# Patient Record
Sex: Female | Born: 1955 | Race: White | Hispanic: No | Marital: Married | Smoking: Never smoker
Health system: Southern US, Community
[De-identification: ages and names within clinical notes are randomized; demographics above are authoritative.]

## PROBLEM LIST (undated history)

## (undated) DIAGNOSIS — M8580 Other specified disorders of bone density and structure, unspecified site: Secondary | ICD-10-CM

## (undated) DIAGNOSIS — E079 Disorder of thyroid, unspecified: Secondary | ICD-10-CM

## (undated) HISTORY — PX: TUBAL LIGATION: SHX77

## (undated) HISTORY — PX: BREAST EXCISIONAL BIOPSY: SUR124

## (undated) HISTORY — PX: WRIST SURGERY: SHX841

## (undated) HISTORY — DX: Disorder of thyroid, unspecified: E07.9

## (undated) HISTORY — PX: BREAST BIOPSY: SHX20

---

## 1997-06-10 ENCOUNTER — Other Ambulatory Visit: Admission: RE | Admit: 1997-06-10 | Discharge: 1997-06-10 | Payer: Self-pay | Admitting: Obstetrics and Gynecology

## 1998-07-28 ENCOUNTER — Other Ambulatory Visit: Admission: RE | Admit: 1998-07-28 | Discharge: 1998-07-28 | Payer: Self-pay | Admitting: Obstetrics and Gynecology

## 1998-11-30 ENCOUNTER — Encounter: Admission: RE | Admit: 1998-11-30 | Discharge: 1998-11-30 | Payer: Self-pay | Admitting: Obstetrics and Gynecology

## 2000-05-02 ENCOUNTER — Other Ambulatory Visit: Admission: RE | Admit: 2000-05-02 | Discharge: 2000-05-02 | Payer: Self-pay | Admitting: Obstetrics and Gynecology

## 2001-05-07 ENCOUNTER — Other Ambulatory Visit: Admission: RE | Admit: 2001-05-07 | Discharge: 2001-05-07 | Payer: Self-pay | Admitting: Obstetrics and Gynecology

## 2001-05-12 ENCOUNTER — Encounter: Admission: RE | Admit: 2001-05-12 | Discharge: 2001-05-12 | Payer: Self-pay | Admitting: Obstetrics and Gynecology

## 2001-05-12 ENCOUNTER — Encounter: Payer: Self-pay | Admitting: Obstetrics and Gynecology

## 2002-05-20 ENCOUNTER — Encounter: Payer: Self-pay | Admitting: Obstetrics and Gynecology

## 2002-05-20 ENCOUNTER — Encounter: Admission: RE | Admit: 2002-05-20 | Discharge: 2002-05-20 | Payer: Self-pay | Admitting: Obstetrics and Gynecology

## 2003-06-22 ENCOUNTER — Encounter: Admission: RE | Admit: 2003-06-22 | Discharge: 2003-06-22 | Payer: Self-pay | Admitting: Obstetrics and Gynecology

## 2004-06-30 ENCOUNTER — Encounter: Admission: RE | Admit: 2004-06-30 | Discharge: 2004-06-30 | Payer: Self-pay | Admitting: Obstetrics and Gynecology

## 2005-08-23 ENCOUNTER — Encounter: Admission: RE | Admit: 2005-08-23 | Discharge: 2005-08-23 | Payer: Self-pay | Admitting: Obstetrics and Gynecology

## 2006-08-30 ENCOUNTER — Encounter: Admission: RE | Admit: 2006-08-30 | Discharge: 2006-08-30 | Payer: Self-pay | Admitting: Obstetrics and Gynecology

## 2007-05-08 ENCOUNTER — Ambulatory Visit (HOSPITAL_BASED_OUTPATIENT_CLINIC_OR_DEPARTMENT_OTHER): Admission: RE | Admit: 2007-05-08 | Discharge: 2007-05-08 | Payer: Self-pay | Admitting: Orthopedic Surgery

## 2007-09-16 ENCOUNTER — Encounter: Admission: RE | Admit: 2007-09-16 | Discharge: 2007-09-16 | Payer: Self-pay | Admitting: Obstetrics and Gynecology

## 2008-09-24 ENCOUNTER — Encounter: Admission: RE | Admit: 2008-09-24 | Discharge: 2008-09-24 | Payer: Self-pay | Admitting: Obstetrics and Gynecology

## 2008-10-14 ENCOUNTER — Ambulatory Visit (HOSPITAL_BASED_OUTPATIENT_CLINIC_OR_DEPARTMENT_OTHER): Admission: RE | Admit: 2008-10-14 | Discharge: 2008-10-14 | Payer: Self-pay | Admitting: Orthopedic Surgery

## 2009-11-21 ENCOUNTER — Encounter: Admission: RE | Admit: 2009-11-21 | Discharge: 2009-11-21 | Payer: Self-pay | Admitting: Obstetrics and Gynecology

## 2010-03-12 ENCOUNTER — Encounter: Payer: Self-pay | Admitting: Obstetrics and Gynecology

## 2010-07-04 NOTE — Op Note (Signed)
Margaret Gentry, Margaret Gentry                ACCOUNT NO.:  [id number]   MEDICAL RECORD NO.:  [id number]          PATIENT TYPE:  AMB   LOCATION:  DSC                          FACILITY:  [facility]   PHYSICIAN:  Cindee Salt, M.D.       DATE OF BIRTH:  Nov 19, 1955   DATE OF PROCEDURE:  05/08/2007  DATE OF DISCHARGE:                               OPERATIVE REPORT   PREOPERATIVE DIAGNOSIS:  Intra-articular comminuted fracture left distal  radius.   POSTOPERATIVE DIAGNOSIS:  Intra-articular comminuted fracture left  distal radius.   OPERATION:  Open reduction internal fixation left distal radius with  hand innovations, standard DVR of the left plate with interfragmentary  screws.   SURGEON:  Cindee Salt, M.D.   ASSISTANT:  Carolyne Fiscal R.N.   ANESTHESIA:  General.   ANESTHESIOLOGIST:  Maren Beach, M.D.   HISTORY:  The patient is a 55 year old female who suffered a fracture of  her left distal radius snow boarding approximately 5 days ago.  She is  admitted for open reduction internal fixation.  She is aware of risks  and complications; including infection, recurrence, injury to arteries,  nerves, tendons; complete relief of symptoms; dystrophy; nonunion; loss  of fixation; potential penetration of the articular surface.  In the  preoperative area the patient is seen, questions encouraged and  answered.   PROCEDURE:  Antibiotic given.  The patient was brought to the operating  room where a general anesthetic was carried out under Dr. Michaelle Copas  direction using LMA.  She was prepped using DuraPrep, supine position,  left arm free.  The limb was manipulated.  X-rays revealed reduction of  the fracture.  The elbow was also x-rayed.  No discrete fractures were  identified in both AP and lateral directions.  The limb was  exsanguinated with an Esmarch bandage.  Tourniquet placed high in the  arm, and was inflated to 250 mmHg.   A volar radial incision was made, carried to the radial side of the  styloid; and then back ulnarly, carried down through subcutaneous  tissue.  Bleeders were electrocauterized.  The dissection carried  through the flexor carpi radialis sheath.  The radial artery was  identified and protected.  Dissection carried down to the pronator  quadratus which was elevated off from the distal radius.  Fracture  fragments were immediately encountered.  A large ulnar spike of the  ulnar fragment was noted.  This was manipulated and reduced.  X-rays  confirmed reduction in the  AP and lateral direction.  The brachial  radialis did not form a significant deforming force; and as such, was  not removed.  The retractors were placed.   The fracture maintained in a reduced position.  It was decided to place  an interfragmentary screw using one of the hand innovations, fully  threaded, non locking screws.  This was drilled from the radial styloid  into the ulnar fragment.  This was first placed with a 22 mm screw which  x-rays revealed to be short.  This was replaced with a 26 mm screw which  stabilized the fracture fragment,  and held it in position.  A standard  sized DVR plate was then selected.  After drying both the narrow plate  and the standard plate, it was found that the standard plate fit, but  would be very close to both radial and ulnar side.  This was then  positioned.  The gliding screw hole was then drilled.  A 12 mm screw was  inserted.  X-rays confirmed positioning of the plate.  This was then  pinned with 45 K-wires.  The distal screws were then placed.  The  initial nonlocking screw on the ulnar side was placed after drilling and  measuring this was found to be a 22 mm screw, this was placed.  X-rays  confirmed that was not in the articular surface. and fully brought the  plate to the bone surface.  The remaining pegs were placed on the ulnar  side.  These measured between 18 and 24 mm.  The radial locking screws  were then placed.  X-rays confirmed that  none were within the joint.  The fracture was maintained in reduction with both normal tilt, normal  angulation, and normal length.  The remaining proximal locking screws  were then placed.  These all measured 12 mm.  X-rays confirmed  positioning of plate and screws.  The wound was irrigated.   The pronator quadratus was then repaired with figure-of-eight 4-0  Mersilene sutures.  The subcutaneous tissue closed with interrupted 4-0  Vicryl sutures as were the sutures for the pronator quadratus rather  than Mersilene.  The skin was closed with interrupted 4-0 Vicryl Rapide  sutures.  A sterile compressive dorsal palmar splint applied.  The  patient tolerated the procedure well and with deflation of the  tourniquet all fingers immediately pinked.  She was taken to the  recovery room for observation in satisfactory condition.  She will be  discharged home to return to the [facility] in 1 week on  Percocet.           ______________________________  Cindee Salt, M.D.     GK/MEDQ  D:  05/08/2007  T:  05/08/2007  Job:  [id number]

## 2010-07-04 NOTE — Op Note (Signed)
Margaret Gentry, Margaret Gentry                ACCOUNT NO.:  [id number]   MEDICAL RECORD NO.:  [id number]          PATIENT TYPE:  AMB   LOCATION:  DSC                          FACILITY:  [facility]   PHYSICIAN:  Cindee Salt, M.D.       DATE OF BIRTH:  [birth year]   DATE OF PROCEDURE:  10/14/2008  DATE OF DISCHARGE:                               OPERATIVE REPORT   PREOPERATIVE DIAGNOSIS:  Status post open reduction, internal fixation  left distal radius with Hand Innovation Plate.   POSTOPERATIVE DIAGNOSIS:  Status post open reduction, internal fixation  left distal radius with Hand Innovation Plate.   OPERATION:  Removal of plate and screws left distal radius.   SURGEON:  Dr. Merlyn Lot.   ASSISTANT:  __________   ANESTHESIA:  Axillary block with local infiltration.   ANESTHESIOLOGIST:  Burna Forts, M.D.   HISTORY:  The patient is a 55 year old female who suffered a distal  radius fracture and underwent open reduction, internal fixation.  She  has had some irritation from the plate and it has been elected to remove  this.  The postoperative course has been discussed along with the risks  and complications.  She is aware that there is no guarantee with  surgery, possibility of infection, recurrence of injury to arteries,  nerves, tendons, complete relief of symptoms, dystrophy.  In the  preoperative area, the patient is seen.  The extremity marked with both  patient and surgeon.  Antibiotic given.   PROCEDURE:  The patient was brought to the operating room where an  axillary block was carried out without difficulty.  She was prepped  using ChloraPrep, supine position, left arm free.  A 3-minute dry time  was allowed.  A timeout taken confirming the patient and procedure.  The  patient was then draped.  The limb exsanguinated with an Esmarch  bandage.  A tourniquet placed high and the arm was inflated 250 mmHg.  The old incision was used.  She had some feeling, this was locally  infiltrated  with quarter-percent Marcaine without epinephrine,  approximately 8 mL was used.  The dissection was carried down through  subcutaneous tissue.  The interval between the flexor carpi radialis  radial artery was opened.  This was followed down to the pronator  quadratus and FPL muscle attachment.  This was incised on its radial  border.  The plate was immediately identified.  The periosteum pronator  was elevated from the plate.  Significant bony growth was present about  the plate.  The proximal screws were removed without difficulty with the  [place], however, a screwdriver for the distal screws was  not available.  This was flashed, having been removed from the Hand  Innovation Set.  The distal screws were then removed without difficulty.  The plate was removed after removal of bony growth with curettes and  rongeur.  The plate was given to the patient along with the screws at  their request.  The area was debrided, irrigated.  The periosteum  pronator quadratus was then repaired with figure-of-eight 4-0 Vicryl  sutures,  the subcutaneous tissue with interrupted 4-0 Vicryl and the  skin with a subcuticular 4-0 Vicryl Rapide suture.  A sterile  compressive dressing, wrist splint was  applied.  Deflation of the tourniquet, all fingers immediately pinked.  She was taken to the recovery room for observation in satisfactory  condition.  She will be discharged home to return to the [facility]  [place] in 1 week on Vicodin.           ______________________________  Cindee Salt, M.D.     GK/MEDQ  D:  10/14/2008  T:  10/14/2008  Job:  [id number]   cc:   Malachi Pro. Ambrose Mantle, M.D.  Fax: [phone]

## 2010-10-27 ENCOUNTER — Other Ambulatory Visit: Payer: Self-pay | Admitting: Obstetrics and Gynecology

## 2010-10-27 DIAGNOSIS — Z1231 Encounter for screening mammogram for malignant neoplasm of breast: Secondary | ICD-10-CM

## 2010-11-24 ENCOUNTER — Ambulatory Visit
Admission: RE | Admit: 2010-11-24 | Discharge: 2010-11-24 | Disposition: A | Discharge status: Home / Self Care | Payer: 59 | Source: Ambulatory Visit | Attending: Obstetrics and Gynecology | Admitting: Obstetrics and Gynecology

## 2010-11-24 DIAGNOSIS — Z1231 Encounter for screening mammogram for malignant neoplasm of breast: Secondary | ICD-10-CM

## 2011-12-12 ENCOUNTER — Other Ambulatory Visit: Payer: Self-pay | Admitting: Obstetrics and Gynecology

## 2011-12-12 DIAGNOSIS — Z1231 Encounter for screening mammogram for malignant neoplasm of breast: Secondary | ICD-10-CM

## 2012-01-14 ENCOUNTER — Ambulatory Visit
Admission: RE | Admit: 2012-01-14 | Discharge: 2012-01-14 | Disposition: A | Discharge status: Home / Self Care | Payer: 59 | Source: Ambulatory Visit | Attending: Obstetrics and Gynecology | Admitting: Obstetrics and Gynecology

## 2012-01-14 DIAGNOSIS — Z1231 Encounter for screening mammogram for malignant neoplasm of breast: Secondary | ICD-10-CM

## 2012-09-26 ENCOUNTER — Other Ambulatory Visit: Payer: Self-pay | Admitting: Family Medicine

## 2012-09-26 DIAGNOSIS — R109 Unspecified abdominal pain: Secondary | ICD-10-CM

## 2012-10-03 ENCOUNTER — Other Ambulatory Visit: Payer: Self-pay | Admitting: Family Medicine

## 2012-10-03 ENCOUNTER — Ambulatory Visit
Admission: RE | Admit: 2012-10-03 | Discharge: 2012-10-03 | Disposition: A | Discharge status: Home / Self Care | Payer: 59 | Source: Ambulatory Visit | Attending: Family Medicine | Admitting: Family Medicine

## 2012-10-03 DIAGNOSIS — R109 Unspecified abdominal pain: Secondary | ICD-10-CM

## 2012-10-03 MED ORDER — IOHEXOL 300 MG/ML  SOLN
100.0000 mL | Freq: Once | INTRAMUSCULAR | Status: AC | PRN
  Administered 2012-10-03: 100 mL via INTRAVENOUS

## 2012-12-18 ENCOUNTER — Other Ambulatory Visit: Payer: Self-pay

## 2012-12-18 DIAGNOSIS — Z1231 Encounter for screening mammogram for malignant neoplasm of breast: Secondary | ICD-10-CM

## 2013-01-19 ENCOUNTER — Encounter: Payer: Self-pay | Admitting: Podiatry

## 2013-01-19 ENCOUNTER — Ambulatory Visit (INDEPENDENT_AMBULATORY_CARE_PROVIDER_SITE_OTHER): Payer: 59 | Admitting: Podiatry

## 2013-01-19 VITALS — BP 122/70 | HR 61 | Resp 18

## 2013-01-19 DIAGNOSIS — L259 Unspecified contact dermatitis, unspecified cause: Secondary | ICD-10-CM

## 2013-01-19 NOTE — Progress Notes (Signed)
   Subjective:    Patient ID: Margaret Gentry, female    DOB: [birth year], 57 y.o.   MRN: [id number]  HPI I have some fungus on my toenail and has been going on for about 4 months now and week to the doctor about 2 weeks ago and was put on an antibotic and has been using a topical    Review of Systems  Constitutional: Negative.   HENT: Negative.   Eyes: Negative.   Respiratory: Negative.   Cardiovascular: Negative.   Gastrointestinal: Negative.   Endocrine: Negative.   Genitourinary: Negative.   Musculoskeletal: Negative.   Skin: Negative.   Allergic/Immunologic: Negative.   Neurological: Negative.   Hematological: Negative.   Psychiatric/Behavioral: Negative.        Objective:   Physical Exam  Subjective: 57 year old white female orientated x3.  Vascular: The DP and PT pulses are two over four bilaterally  Neurological: Sensation intact bilaterally  Dermatological: The distal left hallux has some scaling skin and the distal nail plate is detaching from the nail bed. No active drainage noted.       Assessment & Plan:   Assessment: Evidence of old resolved infection, with healing skin on the distal left hallux.  Plan: I debrided the distal left hallux and saw no evidence of skin lesions, or drainage or dystrophic nail changes. Patient advised to apply Vaseline or topical antibiotic to the distal toe on the left hallux daily, cover with a Band-Aid until healed. Reappoint at patient's request.

## 2013-01-19 NOTE — Patient Instructions (Signed)
Apply Vaseline to the end of the left big toe and cover with Band-Aid daily until healed.

## 2013-01-23 ENCOUNTER — Ambulatory Visit
Admission: RE | Admit: 2013-01-23 | Discharge: 2013-01-23 | Disposition: A | Discharge status: Home / Self Care | Payer: 59 | Source: Ambulatory Visit

## 2013-01-23 DIAGNOSIS — Z1231 Encounter for screening mammogram for malignant neoplasm of breast: Secondary | ICD-10-CM

## 2014-01-08 ENCOUNTER — Other Ambulatory Visit: Payer: Self-pay

## 2014-01-08 DIAGNOSIS — Z1231 Encounter for screening mammogram for malignant neoplasm of breast: Secondary | ICD-10-CM

## 2014-01-29 ENCOUNTER — Ambulatory Visit
Admission: RE | Admit: 2014-01-29 | Discharge: 2014-01-29 | Disposition: A | Discharge status: Home / Self Care | Payer: 59 | Source: Ambulatory Visit

## 2014-01-29 ENCOUNTER — Encounter (INDEPENDENT_AMBULATORY_CARE_PROVIDER_SITE_OTHER): Payer: Self-pay

## 2014-01-29 DIAGNOSIS — Z1231 Encounter for screening mammogram for malignant neoplasm of breast: Secondary | ICD-10-CM

## 2014-10-13 ENCOUNTER — Other Ambulatory Visit: Payer: Self-pay

## 2014-10-13 DIAGNOSIS — Z1231 Encounter for screening mammogram for malignant neoplasm of breast: Secondary | ICD-10-CM

## 2014-11-05 ENCOUNTER — Ambulatory Visit
Admission: RE | Admit: 2014-11-05 | Discharge: 2014-11-05 | Disposition: A | Discharge status: Home / Self Care | Payer: 59 | Source: Ambulatory Visit

## 2014-11-05 DIAGNOSIS — Z1231 Encounter for screening mammogram for malignant neoplasm of breast: Secondary | ICD-10-CM

## 2015-08-07 ENCOUNTER — Emergency Department (HOSPITAL_COMMUNITY): Payer: BLUE CROSS/BLUE SHIELD

## 2015-08-07 ENCOUNTER — Emergency Department (HOSPITAL_COMMUNITY): Payer: BLUE CROSS/BLUE SHIELD | Admitting: Anesthesiology

## 2015-08-07 ENCOUNTER — Encounter (HOSPITAL_COMMUNITY): Payer: Self-pay | Admitting: Emergency Medicine

## 2015-08-07 ENCOUNTER — Observation Stay (HOSPITAL_COMMUNITY)
Admission: EM | Admit: 2015-08-07 | Discharge: 2015-08-08 | Disposition: A | Discharge status: Home / Self Care | Payer: BLUE CROSS/BLUE SHIELD | Attending: Orthopedic Surgery | Admitting: Orthopedic Surgery

## 2015-08-07 ENCOUNTER — Encounter (HOSPITAL_COMMUNITY)
Admission: EM | Disposition: A | Discharge status: Home / Self Care | Payer: Self-pay | Source: Home / Self Care | Attending: Emergency Medicine

## 2015-08-07 DIAGNOSIS — Y9301 Activity, walking, marching and hiking: Secondary | ICD-10-CM | POA: Insufficient documentation

## 2015-08-07 DIAGNOSIS — S52571A Other intraarticular fracture of lower end of right radius, initial encounter for closed fracture: Secondary | ICD-10-CM | POA: Diagnosis not present

## 2015-08-07 DIAGNOSIS — G5601 Carpal tunnel syndrome, right upper limb: Secondary | ICD-10-CM | POA: Diagnosis not present

## 2015-08-07 DIAGNOSIS — Q7190 Unspecified reduction defect of unspecified upper limb: Secondary | ICD-10-CM

## 2015-08-07 DIAGNOSIS — S5291XA Unspecified fracture of right forearm, initial encounter for closed fracture: Secondary | ICD-10-CM

## 2015-08-07 DIAGNOSIS — S6411XA Injury of median nerve at wrist and hand level of right arm, initial encounter: Secondary | ICD-10-CM | POA: Diagnosis not present

## 2015-08-07 DIAGNOSIS — S52611A Displaced fracture of right ulna styloid process, initial encounter for closed fracture: Secondary | ICD-10-CM | POA: Insufficient documentation

## 2015-08-07 DIAGNOSIS — Z792 Long term (current) use of antibiotics: Secondary | ICD-10-CM | POA: Diagnosis not present

## 2015-08-07 DIAGNOSIS — W010XXA Fall on same level from slipping, tripping and stumbling without subsequent striking against object, initial encounter: Secondary | ICD-10-CM | POA: Diagnosis not present

## 2015-08-07 DIAGNOSIS — Z79899 Other long term (current) drug therapy: Secondary | ICD-10-CM | POA: Diagnosis not present

## 2015-08-07 DIAGNOSIS — Y92009 Unspecified place in unspecified non-institutional (private) residence as the place of occurrence of the external cause: Secondary | ICD-10-CM | POA: Diagnosis not present

## 2015-08-07 DIAGNOSIS — W19XXXA Unspecified fall, initial encounter: Secondary | ICD-10-CM

## 2015-08-07 DIAGNOSIS — S52509A Unspecified fracture of the lower end of unspecified radius, initial encounter for closed fracture: Secondary | ICD-10-CM | POA: Diagnosis present

## 2015-08-07 DIAGNOSIS — E039 Hypothyroidism, unspecified: Secondary | ICD-10-CM | POA: Insufficient documentation

## 2015-08-07 DIAGNOSIS — S6990XA Unspecified injury of unspecified wrist, hand and finger(s), initial encounter: Secondary | ICD-10-CM | POA: Diagnosis present

## 2015-08-07 HISTORY — PX: OPEN REDUCTION INTERNAL FIXATION (ORIF) DISTAL RADIAL FRACTURE: SHX5989

## 2015-08-07 SURGERY — OPEN REDUCTION INTERNAL FIXATION (ORIF) DISTAL RADIUS FRACTURE
Anesthesia: General | Laterality: Right

## 2015-08-07 MED ORDER — LACTATED RINGERS IV SOLN
INTRAVENOUS | Status: DC | PRN
  Administered 2015-08-07 (×3): via INTRAVENOUS

## 2015-08-07 MED ORDER — BUPIVACAINE-EPINEPHRINE (PF) 0.5% -1:200000 IJ SOLN
INTRAMUSCULAR | Status: DC | PRN
  Administered 2015-08-07: 25 mL via PERINEURAL

## 2015-08-07 MED ORDER — LEVOTHYROXINE SODIUM 75 MCG PO TABS
75.0000 ug | ORAL_TABLET | Freq: Every day | ORAL | Status: DC
  Administered 2015-08-08: 75 ug via ORAL
  Filled 2015-08-07: qty 1

## 2015-08-07 MED ORDER — MORPHINE SULFATE (PF) 2 MG/ML IV SOLN: 1.0000 mg | INTRAVENOUS | Status: DC | PRN

## 2015-08-07 MED ORDER — ETOMIDATE 2 MG/ML IV SOLN
INTRAVENOUS | Status: AC | PRN
  Administered 2015-08-07: 10 mg via INTRAVENOUS

## 2015-08-07 MED ORDER — LACTATED RINGERS IV SOLN: INTRAVENOUS | Status: DC | PRN

## 2015-08-07 MED ORDER — ONDANSETRON HCL 4 MG/2ML IJ SOLN
INTRAMUSCULAR | Status: AC
Start: 2015-08-07 — End: 2015-08-07
  Filled 2015-08-07: qty 2

## 2015-08-07 MED ORDER — SUCCINYLCHOLINE CHLORIDE 200 MG/10ML IV SOSY
PREFILLED_SYRINGE | INTRAVENOUS | Status: AC
  Filled 2015-08-07: qty 10

## 2015-08-07 MED ORDER — CEFAZOLIN SODIUM 1 G IJ SOLR
INTRAMUSCULAR | Status: DC | PRN
  Administered 2015-08-07: 2 g via INTRAMUSCULAR

## 2015-08-07 MED ORDER — ONDANSETRON HCL 4 MG/2ML IJ SOLN
INTRAMUSCULAR | Status: DC | PRN
  Administered 2015-08-07: 4 mg via INTRAVENOUS

## 2015-08-07 MED ORDER — EPHEDRINE SULFATE 50 MG/ML IJ SOLN
INTRAMUSCULAR | Status: DC | PRN
  Administered 2015-08-07: 15 mg via INTRAVENOUS
  Administered 2015-08-07 (×2): 10 mg via INTRAVENOUS
  Administered 2015-08-07: 15 mg via INTRAVENOUS

## 2015-08-07 MED ORDER — FAMOTIDINE 20 MG PO TABS: 20.0000 mg | ORAL_TABLET | Freq: Two times a day (BID) | ORAL | Status: DC | PRN

## 2015-08-07 MED ORDER — LIDOCAINE HCL (PF) 1 % IJ SOLN
INTRAMUSCULAR | Status: AC
  Administered 2015-08-07: 10:00:00
  Filled 2015-08-07: qty 5

## 2015-08-07 MED ORDER — METHOCARBAMOL 1000 MG/10ML IJ SOLN
500.0000 mg | Freq: Four times a day (QID) | INTRAVENOUS | Status: DC | PRN
  Filled 2015-08-07: qty 5

## 2015-08-07 MED ORDER — KETOROLAC TROMETHAMINE 30 MG/ML IJ SOLN
30.0000 mg | Freq: Once | INTRAMUSCULAR | Status: AC
  Administered 2015-08-07: 30 mg via INTRAMUSCULAR
  Filled 2015-08-07: qty 1

## 2015-08-07 MED ORDER — ONDANSETRON HCL 4 MG/2ML IJ SOLN
INTRAMUSCULAR | Status: AC
  Filled 2015-08-07: qty 2

## 2015-08-07 MED ORDER — CEFAZOLIN SODIUM 1-5 GM-% IV SOLN
1.0000 g | Freq: Three times a day (TID) | INTRAVENOUS | Status: DC
  Administered 2015-08-07 – 2015-08-08 (×2): 1 g via INTRAVENOUS
  Filled 2015-08-07 (×5): qty 50

## 2015-08-07 MED ORDER — MIDAZOLAM HCL 2 MG/2ML IJ SOLN
INTRAMUSCULAR | Status: DC | PRN
  Administered 2015-08-07: 2 mg via INTRAVENOUS

## 2015-08-07 MED ORDER — ONDANSETRON HCL 4 MG/2ML IJ SOLN
4.0000 mg | Freq: Once | INTRAMUSCULAR | Status: AC
  Administered 2015-08-07: 4 mg via INTRAVENOUS

## 2015-08-07 MED ORDER — DEXAMETHASONE SODIUM PHOSPHATE 10 MG/ML IJ SOLN
INTRAMUSCULAR | Status: AC
  Filled 2015-08-07: qty 1

## 2015-08-07 MED ORDER — PROPOFOL 10 MG/ML IV BOLUS
INTRAVENOUS | Status: AC
  Filled 2015-08-07: qty 20

## 2015-08-07 MED ORDER — FENTANYL CITRATE (PF) 250 MCG/5ML IJ SOLN
INTRAMUSCULAR | Status: AC
  Filled 2015-08-07: qty 5

## 2015-08-07 MED ORDER — CEFAZOLIN SODIUM 1-5 GM-% IV SOLN
INTRAVENOUS | Status: AC
  Filled 2015-08-07: qty 50

## 2015-08-07 MED ORDER — HYDROMORPHONE HCL 1 MG/ML IJ SOLN
1.0000 mg | Freq: Once | INTRAMUSCULAR | Status: AC
  Administered 2015-08-07: 1 mg via INTRAMUSCULAR
  Filled 2015-08-07: qty 1

## 2015-08-07 MED ORDER — ETOMIDATE 2 MG/ML IV SOLN
10.0000 mg | Freq: Once | INTRAVENOUS | Status: DC
  Filled 2015-08-07: qty 10

## 2015-08-07 MED ORDER — FENTANYL CITRATE (PF) 250 MCG/5ML IJ SOLN
INTRAMUSCULAR | Status: DC | PRN
  Administered 2015-08-07 (×2): 50 ug via INTRAVENOUS

## 2015-08-07 MED ORDER — EPHEDRINE 5 MG/ML INJ
INTRAVENOUS | Status: AC
  Filled 2015-08-07: qty 10

## 2015-08-07 MED ORDER — DEXAMETHASONE SODIUM PHOSPHATE 10 MG/ML IJ SOLN
INTRAMUSCULAR | Status: DC | PRN
  Administered 2015-08-07: 10 mg via INTRAVENOUS

## 2015-08-07 MED ORDER — MEPERIDINE HCL 25 MG/ML IJ SOLN: 6.2500 mg | INTRAMUSCULAR | Status: DC | PRN

## 2015-08-07 MED ORDER — DOCUSATE SODIUM 100 MG PO CAPS
100.0000 mg | ORAL_CAPSULE | Freq: Two times a day (BID) | ORAL | Status: DC
  Administered 2015-08-07 – 2015-08-08 (×2): 100 mg via ORAL
  Filled 2015-08-07 (×2): qty 1

## 2015-08-07 MED ORDER — VITAMIN C 500 MG PO TABS
1000.0000 mg | ORAL_TABLET | Freq: Every day | ORAL | Status: DC
  Administered 2015-08-07 – 2015-08-08 (×2): 1000 mg via ORAL
  Filled 2015-08-07 (×2): qty 2

## 2015-08-07 MED ORDER — SCOPOLAMINE 1 MG/3DAYS TD PT72
MEDICATED_PATCH | TRANSDERMAL | Status: DC | PRN
  Administered 2015-08-07: 1 via TRANSDERMAL

## 2015-08-07 MED ORDER — ONDANSETRON HCL 4 MG/2ML IJ SOLN
4.0000 mg | Freq: Four times a day (QID) | INTRAMUSCULAR | Status: DC | PRN
Start: 2015-08-07 — End: 2015-08-08

## 2015-08-07 MED ORDER — ONDANSETRON HCL 4 MG PO TABS: 4.0000 mg | ORAL_TABLET | Freq: Four times a day (QID) | ORAL | Status: DC | PRN

## 2015-08-07 MED ORDER — LACTATED RINGERS IV SOLN
INTRAVENOUS | Status: DC
  Administered 2015-08-07: 17:00:00 via INTRAVENOUS

## 2015-08-07 MED ORDER — ALPRAZOLAM 0.5 MG PO TABS: 0.5000 mg | ORAL_TABLET | Freq: Four times a day (QID) | ORAL | Status: DC | PRN

## 2015-08-07 MED ORDER — OXYCODONE HCL 5 MG PO TABS: 5.0000 mg | ORAL_TABLET | Freq: Once | ORAL | Status: DC | PRN

## 2015-08-07 MED ORDER — OXYCODONE HCL 5 MG PO TABS
5.0000 mg | ORAL_TABLET | ORAL | Status: DC | PRN
  Administered 2015-08-08: 10 mg via ORAL
  Administered 2015-08-08: 5 mg via ORAL
  Filled 2015-08-07 (×2): qty 1
  Filled 2015-08-07: qty 2

## 2015-08-07 MED ORDER — PROPOFOL 10 MG/ML IV BOLUS
INTRAVENOUS | Status: DC | PRN
  Administered 2015-08-07: 200 mg via INTRAVENOUS

## 2015-08-07 MED ORDER — LIDOCAINE 2% (20 MG/ML) 5 ML SYRINGE
INTRAMUSCULAR | Status: AC
Start: 2015-08-07 — End: 2015-08-07
  Filled 2015-08-07: qty 5

## 2015-08-07 MED ORDER — PROMETHAZINE HCL 25 MG RE SUPP: 12.5000 mg | Freq: Four times a day (QID) | RECTAL | Status: DC | PRN

## 2015-08-07 MED ORDER — HYDROMORPHONE HCL 1 MG/ML IJ SOLN
INTRAMUSCULAR | Status: AC
  Administered 2015-08-07: 1 mg
  Filled 2015-08-07: qty 1

## 2015-08-07 MED ORDER — MIDAZOLAM HCL 2 MG/2ML IJ SOLN
INTRAMUSCULAR | Status: AC
  Filled 2015-08-07: qty 2

## 2015-08-07 MED ORDER — HYDROMORPHONE HCL 1 MG/ML IJ SOLN: 0.2500 mg | INTRAMUSCULAR | Status: DC | PRN

## 2015-08-07 MED ORDER — 0.9 % SODIUM CHLORIDE (POUR BTL) OPTIME
TOPICAL | Status: DC | PRN
  Administered 2015-08-07: 1000 mL

## 2015-08-07 MED ORDER — CEFAZOLIN SODIUM 1-5 GM-% IV SOLN
1.0000 g | INTRAVENOUS | Status: AC
  Administered 2015-08-07: 1 g via INTRAVENOUS

## 2015-08-07 MED ORDER — OXYCODONE HCL 5 MG/5ML PO SOLN: 5.0000 mg | Freq: Once | ORAL | Status: DC | PRN

## 2015-08-07 MED ORDER — METHOCARBAMOL 500 MG PO TABS
500.0000 mg | ORAL_TABLET | Freq: Four times a day (QID) | ORAL | Status: DC | PRN
  Administered 2015-08-08: 500 mg via ORAL
  Filled 2015-08-07: qty 1

## 2015-08-07 SURGICAL SUPPLY — 63 items
BANDAGE ACE 3X5.8 VEL STRL LF (GAUZE/BANDAGES/DRESSINGS) ×3 IMPLANT
BANDAGE ACE 4X5 VEL STRL LF (GAUZE/BANDAGES/DRESSINGS) ×3 IMPLANT
BANDAGE ELASTIC 3 VELCRO ST LF (GAUZE/BANDAGES/DRESSINGS) ×3 IMPLANT
BANDAGE ELASTIC 4 VELCRO ST LF (GAUZE/BANDAGES/DRESSINGS) ×3 IMPLANT
BIT DRILL 2.2 SS TIBIAL (BIT) ×3 IMPLANT
BLADE SURG ROTATE 9660 (MISCELLANEOUS) IMPLANT
BNDG ESMARK 4X9 LF (GAUZE/BANDAGES/DRESSINGS) IMPLANT
BNDG GAUZE ELAST 4 BULKY (GAUZE/BANDAGES/DRESSINGS) ×9 IMPLANT
CORDS BIPOLAR (ELECTRODE) ×3 IMPLANT
COVER SURGICAL LIGHT HANDLE (MISCELLANEOUS) ×3 IMPLANT
CUFF TOURNIQUET SINGLE 18IN (TOURNIQUET CUFF) ×3 IMPLANT
DECANTER SPIKE VIAL GLASS SM (MISCELLANEOUS) IMPLANT
DRAIN TLS ROUND 10FR (DRAIN) IMPLANT
DRAPE OEC MINIVIEW 54X84 (DRAPES) ×3 IMPLANT
DRAPE U-SHAPE 47X51 STRL (DRAPES) ×3 IMPLANT
DRSG ADAPTIC 3X8 NADH LF (GAUZE/BANDAGES/DRESSINGS) ×3 IMPLANT
GAUZE SPONGE 4X4 12PLY STRL (GAUZE/BANDAGES/DRESSINGS) ×6 IMPLANT
GAUZE XEROFORM 1X8 LF (GAUZE/BANDAGES/DRESSINGS) ×3 IMPLANT
GAUZE XEROFORM 5X9 LF (GAUZE/BANDAGES/DRESSINGS) ×3 IMPLANT
GLOVE BIOGEL PI IND STRL 7.5 (GLOVE) ×2 IMPLANT
GLOVE BIOGEL PI INDICATOR 7.5 (GLOVE) ×4
GLOVE SS BIOGEL STRL SZ 8 (GLOVE) ×1 IMPLANT
GLOVE SUPERSENSE BIOGEL SZ 8 (GLOVE) ×2
GOWN STRL REUS W/ TWL LRG LVL3 (GOWN DISPOSABLE) ×1 IMPLANT
GOWN STRL REUS W/ TWL XL LVL3 (GOWN DISPOSABLE) ×1 IMPLANT
GOWN STRL REUS W/TWL LRG LVL3 (GOWN DISPOSABLE) ×2
GOWN STRL REUS W/TWL XL LVL3 (GOWN DISPOSABLE) ×3
KIT BASIN OR (CUSTOM PROCEDURE TRAY) ×3 IMPLANT
KIT ROOM TURNOVER OR (KITS) ×3 IMPLANT
LOOP VESSEL MAXI BLUE (MISCELLANEOUS) IMPLANT
MANIFOLD NEPTUNE II (INSTRUMENTS) ×3 IMPLANT
NEEDLE 22X1 1/2 (OR ONLY) (NEEDLE) IMPLANT
NS IRRIG 1000ML POUR BTL (IV SOLUTION) ×3 IMPLANT
PACK ORTHO EXTREMITY (CUSTOM PROCEDURE TRAY) ×3 IMPLANT
PAD ARMBOARD 7.5X6 YLW CONV (MISCELLANEOUS) ×6 IMPLANT
PAD CAST 4YDX4 CTTN HI CHSV (CAST SUPPLIES) ×1 IMPLANT
PADDING CAST ABS 4INX4YD NS (CAST SUPPLIES) ×4
PADDING CAST ABS COTTON 4X4 ST (CAST SUPPLIES) ×2 IMPLANT
PADDING CAST COTTON 4X4 STRL (CAST SUPPLIES) ×2
PEG LOCKING SMOOTH 2.2X16 (Screw) ×3 IMPLANT
PEG LOCKING SMOOTH 2.2X18 (Peg) ×9 IMPLANT
PEG LOCKING SMOOTH 2.2X20 (Screw) ×9 IMPLANT
PILLOW ARM CARTER PEDIATRIC (MISCELLANEOUS) ×3 IMPLANT
PLATE DVR CROSSLOCK STD RT (Plate) ×3 IMPLANT
PUTTY DBM STAGRAFT PLUS 2CC (Putty) ×3 IMPLANT
SCREW LOCK 12X2.7X 3 LD (Screw) ×1 IMPLANT
SCREW LOCK 14X2.7X 3 LD TPR (Screw) ×1 IMPLANT
SCREW LOCKING 2.7X12MM (Screw) ×3 IMPLANT
SCREW LOCKING 2.7X13MM (Screw) ×3 IMPLANT
SCREW LOCKING 2.7X14 (Screw) ×3 IMPLANT
SPONGE LAP 4X18 X RAY DECT (DISPOSABLE) IMPLANT
SUT MNCRL AB 4-0 PS2 18 (SUTURE) ×9 IMPLANT
SUT PROLENE 3 0 PS 2 (SUTURE) ×3 IMPLANT
SUT VIC AB 3-0 FS2 27 (SUTURE) ×3 IMPLANT
SYR CONTROL 10ML LL (SYRINGE) IMPLANT
SYSTEM CHEST DRAIN TLS 7FR (DRAIN) ×3 IMPLANT
TOWEL OR 17X24 6PK STRL BLUE (TOWEL DISPOSABLE) ×3 IMPLANT
TOWEL OR 17X26 10 PK STRL BLUE (TOWEL DISPOSABLE) ×3 IMPLANT
TUBE CONNECTING 12'X1/4 (SUCTIONS) ×1
TUBE CONNECTING 12X1/4 (SUCTIONS) ×2 IMPLANT
TUBE EVACUATION TLS (MISCELLANEOUS) ×3 IMPLANT
UNDERPAD 30X30 INCONTINENT (UNDERPADS AND DIAPERS) ×3 IMPLANT
WATER STERILE IRR 1000ML POUR (IV SOLUTION) ×3 IMPLANT

## 2015-08-07 NOTE — Sedation Documentation (Signed)
Pt placed on 5 lead, 2L [place], BP cuff and pulse ox for post conscious sedation. 1L normal saline started via IV to left AC. Ambu bag at bedside.

## 2015-08-07 NOTE — ED Notes (Signed)
Pt taken to OR per Dr. Amanda PeaGramig

## 2015-08-07 NOTE — Progress Notes (Signed)
Orthopedic Tech Progress Note Patient Details:  Margaret Gentry 01/14/1956 [id number]  Ortho Devices Type of Ortho Device: Ace wrap, Sugartong splint Ortho Device/Splint Interventions: Application   Margaret Gentry 08/07/2015, 10:58 AM

## 2015-08-07 NOTE — ED Notes (Signed)
Pt here after fall today with right wrist pain; obvious deformity noted; pt is severe pain

## 2015-08-07 NOTE — Transfer of Care (Signed)
Immediate Anesthesia Transfer of Care Note  Patient: Shon HaleMary Beth B Jutras  Procedure(s) Performed: Procedure(s): OPEN REDUCTION INTERNAL FIXATION (ORIF) DISTAL RADIAL FRACTURE (Right)  Patient Location: PACU  Anesthesia Type:General and Regional  Level of Consciousness: awake  Airway & Oxygen Therapy: Patient Spontanous Breathing and Patient connected to nasal cannula oxygen  Post-op Assessment: Report given to RN and Post -op Vital signs reviewed and stable  Post vital signs: Reviewed and stable  Last Vitals:  Filed Vitals:   08/07/15 1200 08/07/15 1245  BP: 133/78 114/75  Pulse: 57 61  Resp: 18 11    Last Pain:  Filed Vitals:   08/07/15 1253  PainSc: 10-Worst pain ever         Complications: No apparent anesthesia complications

## 2015-08-07 NOTE — ED Provider Notes (Signed)
CSN: [id number]     Arrival date & time 08/07/15  1004 History   First MD Initiated Contact with Patient 08/07/15 1008     Chief Complaint  Patient presents with  . Wrist Injury     (Consider location/radiation/quality/duration/timing/severity/associated sxs/prior Treatment) HPI Patient presents after mechanical fall. Patient recalls the entirety of the fall. Just prior to ED arrival she was walking, slipped, fell onto her right side and right wrist. Since that time there has been pain about an obvious deformity of the right wrist. Patient states that she is generally well denies substantial medical problems. She does have a history of orthopedic surgery in the contralateral wrist, but no recent issues. Since onset pain is worse with motion, is sharp, severe, throughout the hand with mild numbness throughout the hand as well.  Past Medical History  Diagnosis Date  . Thyroid disease    Past Surgical History  Procedure Laterality Date  . Wrist surgery     History reviewed. No pertinent family history. Social History  Substance Use Topics  . Smoking status: Never Smoker   . Smokeless tobacco: Never Used  . Alcohol Use: No   OB History    No data available     Review of Systems  Constitutional: Negative for fever.  HENT: Negative.   Gastrointestinal: Negative for vomiting.  Musculoskeletal:       Negative aside from HPI  Skin:       Negative aside from HPI  Allergic/Immunologic: Negative for immunocompromised state.  Neurological: Positive for numbness. Negative for weakness.      Allergies  Review of patient's allergies indicates no known allergies.  Home Medications   Prior to Admission medications   Medication Sig Start Date End Date Taking? Authorizing Provider  cephALEXin (KEFLEX) 500 MG capsule  01/02/13   Historical Provider, MD  Estradiol-Norethindrone Acet 0.5-0.1 MG per tablet  12/27/12   Historical Provider, MD  Karlene EinsteinLINZESS 145 MCG CAPS capsule  01/01/13    Historical Provider, MD  SYNTHROID 50 MCG tablet  12/27/12   Historical Provider, MD   There were no vitals taken for this visit. Physical Exam  Constitutional: She is oriented to person, place, and time. She appears well-developed and well-nourished. No distress.  HENT:  Head: Normocephalic and atraumatic.  Eyes: Conjunctivae and EOM are normal.  Cardiovascular: Normal rate, regular rhythm and intact distal pulses.   Pulmonary/Chest: Effort normal. No stridor.  Abdominal: She exhibits no distension.  Musculoskeletal: She exhibits no edema.       Right elbow: Normal.      Right wrist: She exhibits deformity.       Arms: Neurological: She is alert and oriented to person, place, and time. No cranial nerve deficit.  She can feel the hand in all three major nerve distribution areas, though she does have diffuse numbness.  Skin: Skin is warm and dry.  Psychiatric: She has a normal mood and affect.  Nursing note and vitals reviewed.   ED Course  .Sedation Date/Time: 08/07/2015 1:07 PM Performed by: Gerhard MunchLOCKWOOD, Manali Mcelmurry Authorized by: Gerhard MunchLOCKWOOD, Andalyn Heckstall  Consent:    Consent obtained:  Written   Consent given by:  Patient   Risks discussed:  Dysrhythmia, inadequate sedation, nausea, vomiting, respiratory compromise necessitating ventilatory assistance and intubation and prolonged sedation necessitating reversal Indications:    Sedation purpose:  Fracture reduction   Procedure necessitating sedation performed by:  Physician performing sedation   Intended level of sedation:  Moderate (conscious sedation) Pre-sedation assessment:  Time since last food or drink:  5   NPO status caution: urgency dictates proceeding with non-ideal NPO status     ASA classification: class 1 - normal, healthy patient     Neck mobility: normal     Mouth opening:  3 or more finger widths   Thyromental distance:  4 finger widths   Mallampati score:  I - soft palate, uvula, fauces, pillars visible   Pre-sedation  assessments completed and reviewed comment:  Done   Pre-sedation assessment completed:  08/07/2015 11:00 AM Immediate pre-procedure details:    Reassessment: Patient reassessed immediately prior to procedure     Reviewed: vital signs     Verified: bag valve mask available, emergency equipment available, intubation equipment available, IV patency confirmed, oxygen available and suction available   Procedure details (see MAR for exact dosages):    Sedation start time:  08/07/2015 11:45 AM   Preoxygenation:  Nasal cannula   Sedation:  Etomidate   Analgesia:  Hydromorphone   Intra-procedure monitoring:  Blood pressure monitoring, cardiac monitor, continuous capnometry, continuous pulse oximetry, frequent LOC assessments and frequent vital sign checks   Intra-procedure events: none     Sedation end time:  08/07/2015 12:15 PM   Total sedation time (minutes):  30 Post-procedure details:    Post-sedation assessment completed:  08/07/2015 12:45 PM   Attendance: Constant attendance by certified staff until patient recovered     Recovery: Patient returned to pre-procedure baseline     Post-sedation assessments completed and reviewed: airway patency, cardiovascular function, hydration status, mental status and nausea/vomiting     Patient is stable for discharge or admission: Yes     Patient tolerance:  Tolerated well, no immediate complications Comments:     Some nausea w vomiting, otherwise uncomplicated Reduction of fracture Date/Time: 08/07/2015 12:00 PM Performed by: Gerhard Munch Authorized by: Gerhard Munch Consent: Verbal consent obtained. Written consent obtained. Risks and benefits: risks, benefits and alternatives were discussed Consent given by: patient Patient understanding: patient states understanding of the procedure being performed Patient consent: the patient's understanding of the procedure matches consent given Procedure consent: procedure consent matches procedure  scheduled Relevant documents: relevant documents present and verified Test results: test results available and properly labeled Site marked: the operative site was marked Imaging studies: imaging studies available Required items: required blood products, implants, devices, and special equipment available Patient identity confirmed: verbally with patient Time out: Immediately prior to procedure a "time out" was called to verify the correct patient, procedure, equipment, support staff and site/side marked as required. Preparation: Patient was prepped and draped in the usual sterile fashion. Local anesthesia used: yes Anesthesia: hematoma block Local anesthetic: lidocaine 1% without epinephrine Anesthetic total: 4 ml Patient sedated: yes (see other note) Sedation type: moderate (conscious) sedation Sedatives: etomidate Analgesia: hydromorphone Vitals: Vital signs were monitored during sedation. Patient tolerance: Patient tolerated the procedure well with no immediate complications Comments: On arrival the patient had hematoma block applied, intramuscular narcotics applied. Subsequent she was placed in finger traps, with weight for traction. However, this did not achieve appropriate reduction. Subsequently, the patient had conscious sedation, with substantial reduction of the fracture.    (including critical care time)  Imaging Review Dg Wrist 2 Views Right  08/07/2015  CLINICAL DATA:  Obvious deformity to the right wrist. Pt fell backwards today at home and tried to catch herself with her right hand. Most of her pain is on the lateral distal portion of the right wrist. EXAM: RIGHT WRIST -  2 VIEW COMPARISON:  None. FINDINGS: There is a comminuted displaced fracture of the distal radial metaphysis with fractures extending to the distal radial articular surface. The comminuted fracture components have displaced posteriorly with the carpus posterior displacement is at least 12 mm. Fracture  fragments have also displaced proximally by at least 1 cm. There are no other fractures. IMPRESSION: Comminuted and significantly displaced fracture of the distal right radial metaphysis and epiphysis as detailed above. Electronically Signed   By: Amie Portland M.D.   On: 08/07/2015 10:42   I have personally reviewed and evaluated these images and lab results as part of my medical decision-making.  Immediately after the patient's arrival to the emergency department I assessed her given her extreme pain. Performed a hematoma block with 1% lidocaine after obtaining verbal consent. Patient received intermuscular Dilaudid, and had substantial decrease in her pain level. Patient was then placed in fingertrap for reduction of her distal radius fracture after a reviewed the portable wrist x-ray.  Patient tolerated initiation of these fingertrap well.   Update: Patient tolerated reduction after conscious sedation with etomidate well but had persistent numbness throughout her hand. After discussion with evaluation by our hand surgeon colleague, patient was taken to the operating room for carpal tunnel decompression.  MDM   Final diagnoses:  Fall  Distal radius fracture Numbness  Patient presents after mechanical fall, has obvious deformity and substantial pain on arrival. Patient had conscious sedation with reduction of distal radius comminuted fracture. However, the patient had persistent numbness, and with some concern for carpal tunnel compression of median nerve patient went to the operating room with our hand surgery team for additional treatment.  Gerhard Munch, MD 08/07/15 1315

## 2015-08-07 NOTE — Op Note (Signed)
See dictation#[id number] SP ORIF right radius and CTR with fascial release Faisal Stradling MD

## 2015-08-07 NOTE — Progress Notes (Signed)
Pt did well with clear liquid dinner and snack this evening. No nausea reported. Pt also requested scopolamine patch to under L ear be removed. States she does not have any dizziness, nausea, etc. Pt states ok w/ advancing diet for morning meal.

## 2015-08-07 NOTE — H&P (Signed)
Margaret Gentry is an 60 y.o. female.   Chief Complaint: Right radius fracture displaced with acute carpal tunnel syndrome and median nerve contusion HPI: Patient presents for evaluation. Emergency room staff has seen her and performed an initial reduction. Prior to the reduction she had significant numbness in her hand and afterwards the numbness worsened. She had a hematoma block. She's had a prior left wrist fracture. This is the right wrist in question. She had an injury today as described in the notes.  She denies neck back chest or abdominal pain.  I've spoken with the ER physician and patient. I was called after the initial in the our evaluation and reduction. I've did not have the opportunity to evaluate her prior to her reduction and splinting  In my opinion she has some degree of an obvious median nerve contusion but I do feel that she has elements of acute carpal tunnel syndrome at play.  Past Medical History  Diagnosis Date  . Thyroid disease     Past Surgical History  Procedure Laterality Date  . Wrist surgery      History reviewed. No pertinent family history. Social History:  reports that she has never smoked. She has never used smokeless tobacco. She reports that she does not drink alcohol or use illicit drugs.  Allergies: No Known Allergies   (Not in a hospital admission)  No results found for this or any previous visit (from the past 48 hour(s)). Dg Wrist 2 Views Right  08/07/2015  CLINICAL DATA:  Post reduction of right wrist fracture. EXAM: RIGHT WRIST - 2 VIEW COMPARISON:  08/07/2015 FINDINGS: There has been interval reduction of previously demonstrated severely comminuted intra-articular distal radial fracture. There is residual mild dorsal angulation of the distal fracture fragment at the main fracture line. There is mild residual impaction. Due to severe comminution there is a butterfly fragment which is still displaced dorsally. There is an associated soft  tissue swelling. Casting material overlies the wrist. IMPRESSION: Status post reduction of severely comminuted intra-articular distal radial fracture with residual mild dorsal angulation of the main fracture fragments, and dorsal displacement of a butterfly fragment. Mild residual impaction. Electronically Signed   By: Ted Mcalpineobrinka  Dimitrova M.D.   On: 08/07/2015 12:37   Dg Wrist 2 Views Right  08/07/2015  CLINICAL DATA:  Obvious deformity to the right wrist. Pt fell backwards today at home and tried to catch herself with her right hand. Most of her pain is on the lateral distal portion of the right wrist. EXAM: RIGHT WRIST - 2 VIEW COMPARISON:  None. FINDINGS: There is a comminuted displaced fracture of the distal radial metaphysis with fractures extending to the distal radial articular surface. The comminuted fracture components have displaced posteriorly with the carpus posterior displacement is at least 12 mm. Fracture fragments have also displaced proximally by at least 1 cm. There are no other fractures. IMPRESSION: Comminuted and significantly displaced fracture of the distal right radial metaphysis and epiphysis as detailed above. Electronically Signed   By: Amie Portlandavid  Ormond M.D.   On: 08/07/2015 10:42    Review of Systems  HENT: Negative.   Eyes: Negative.   Respiratory: Negative.   Cardiovascular: Negative.   Gastrointestinal: Negative.   Genitourinary: Negative.   Skin: Negative.   Neurological: Negative.   Endo/Heme/Allergies: Negative.     Blood pressure 114/75, pulse 61, resp. rate 11, SpO2 99 %. Physical Exam pleasant female. The right wrist is wrapped. She is tender and cannot move the  fingers. The patient states she has significant numbness in the median nerve distribution. There is no evidence of bleeding. There is no evidence of refill loss. Her graft she is pain with passive extension of the fingers and has acute carpal tunnel symptomatology. Lower extremity examination is benign.  Left upper extremity has IV access and has a prior ORIF performed outside of my office by Dr. Merlyn Lot The patient is alert and oriented in no acute distress. The patient complains of pain in the affected upper extremity.  The patient is noted to have a normal HEENT exam. Lung fields show equal chest expansion and no shortness of breath. Abdomen exam is nontender without distention. Lower extremity examination does not show any fracture dislocation or blood clot symptoms. Pelvis is stable and the neck and back are stable and nontender.  Assessment/Plan #1 acute right distal radius fracture displaced #2 acute carpal tunnel syndrome #3 median nerve contusion right wrist region  I discussed with patient I feel she has a significantly displaced fracture that is improved after the ER reduction. I was called after the ER reduction to assess. My assessment reveals continued displacement as well as ailments of a median nerve which is likely contused and also under compression given the severity of the initial displacement.  I discussed with the patient that it would be my recommendation to consider ORIF and carpal tunnel release as soon as possible due to the median nerve deficit. I discussed with her that she likely has elements of both the contusion and compression. While we will eliminate the compression the contusion may still cause median nerve symptoms which will have to be observed for recovery  The patient prefers to proceed as soon as possible.  We are planning surgery for your upper extremity. The risk and benefits of surgery to include risk of bleeding, infection, anesthesia,  damage to normal structures and failure of the surgery to accomplish its intended goals of relieving symptoms and restoring function have been discussed in detail. With this in mind we plan to proceed. I have specifically discussed with the patient the pre-and postoperative regime and the dos and don'ts and risk and  benefits in great detail. Risk and benefits of surgery also include risk of dystrophy(CRPS), chronic nerve pain, failure of the healing process to go onto completion and other inherent risks of surgery The relavent the pathophysiology of the disease/injury process, as well as the alternatives for treatment and postoperative course of action has been discussed in great detail with the patient who desires to proceed.  We will do everything in our power to help you (the patient) restore function to the upper extremity. It is a pleasure to see this patient today.   Karen Chafe, MD 08/07/2015, 1:08 PM

## 2015-08-07 NOTE — Anesthesia Preprocedure Evaluation (Addendum)
Anesthesia Evaluation  Patient identified by MRN, date of birth, ID band Patient awake    Reviewed: Allergy & Precautions, NPO status , Patient's Chart, lab work & pertinent test results  Airway Mallampati: I  TM Distance: >3 FB Neck ROM: Full    Dental  (+) Teeth Intact, Dental Advisory Given   Pulmonary    breath sounds clear to auscultation       Cardiovascular  Rhythm:Regular Rate:Normal     Neuro/Psych    GI/Hepatic   Endo/Other  Hypothyroidism   Renal/GU      Musculoskeletal   Abdominal   Peds  Hematology   Anesthesia Other Findings   Reproductive/Obstetrics                             Anesthesia Physical Anesthesia Plan  ASA: II and emergent  Anesthesia Plan: General   Post-op Pain Management: GA combined w/ Regional for post-op pain   Induction: Intravenous  Airway Management Planned:   Additional Equipment:   Intra-op Plan:   Post-operative Plan: Extubation in OR  Informed Consent: I have reviewed the patients History and Physical, chart, labs and discussed the procedure including the risks, benefits and alternatives for the proposed anesthesia with the patient or authorized representative who has indicated his/her understanding and acceptance.   Dental advisory given  Plan Discussed with: CRNA, Surgeon and Anesthesiologist  Anesthesia Plan Comments:         Anesthesia Quick Evaluation

## 2015-08-07 NOTE — Anesthesia Procedure Notes (Addendum)
Anesthesia Regional Block:  Infraclavicular brachial plexus block  Pre-Anesthetic Checklist: ,, timeout performed, Correct Patient, Correct Site, Correct Laterality, Correct Procedure, Correct Position, site marked, Risks and benefits discussed,  Surgical consent,  Pre-op evaluation,  At surgeon's request and post-op pain management  Laterality: Right and Upper  Prep: chloraprep       Needles:  Injection technique: Single-shot  Needle Type: Echogenic Stimulator Needle     Needle Length: 5cm 5 cm Needle Gauge: 21 and 21 G    Additional Needles:  Procedures: ultrasound guided (picture in chart) Infraclavicular brachial plexus block Narrative:  Start time: 08/07/2015 1:29 PM End time: 08/07/2015 1:34 PM Injection made incrementally with aspirations every 5 mL.  Performed by: Personally  Anesthesiologist: CREWS, DAVID   Procedure Name: Intubation Date/Time: 08/07/2015 1:52 PM Performed by: Alanda AmassFRIEDMAN, Nasire Reali A Pre-anesthesia Checklist: Patient identified, Emergency Drugs available, Suction available, Patient being monitored and Timeout performed Patient Re-evaluated:Patient Re-evaluated prior to inductionOxygen Delivery Method: Circle System Utilized and Circle system utilized Preoxygenation: Pre-oxygenation with 100% oxygen Intubation Type: IV induction, Rapid sequence and Cricoid Pressure applied Ventilation: Mask ventilation without difficulty Laryngoscope Size: Mac and 3 Grade View: Grade I Tube type: Oral Tube size: 7.5 mm Number of attempts: 1 Airway Equipment and Method: Stylet Placement Confirmation: ETT inserted through vocal cords under direct vision,  positive ETCO2 and breath sounds checked- equal and bilateral Secured at: 21 cm Tube secured with: Tape Dental Injury: Teeth and Oropharynx as per pre-operative assessment       Right Infraclavicular block image

## 2015-08-07 NOTE — Anesthesia Postprocedure Evaluation (Signed)
Anesthesia Post Note  Patient: Margaret Gentry  Procedure(s) Performed: Procedure(s) (LRB): OPEN REDUCTION INTERNAL FIXATION (ORIF) DISTAL RADIAL FRACTURE (Right)  Patient location during evaluation: PACU Anesthesia Type: General Level of consciousness: awake and alert Pain management: pain level controlled Vital Signs Assessment: post-procedure vital signs reviewed and stable Respiratory status: spontaneous breathing, nonlabored ventilation, respiratory function stable and patient connected to nasal cannula oxygen Cardiovascular status: blood pressure returned to baseline and stable Postop Assessment: no signs of nausea or vomiting Anesthetic complications: no    Last Vitals:  Filed Vitals:   08/07/15 1618 08/07/15 1623  BP: 122/76   Pulse: 73 67  Temp:  36.5 C  Resp: 13 15    Last Pain:  Filed Vitals:   08/07/15 1623  PainSc: 10-Worst pain ever                 Melvine Julin A

## 2015-08-08 ENCOUNTER — Encounter (HOSPITAL_COMMUNITY): Payer: Self-pay | Admitting: Orthopedic Surgery

## 2015-08-08 MED ORDER — OXYCODONE HCL 5 MG PO TABS: 5.0000 mg | ORAL_TABLET | ORAL | Status: DC | PRN

## 2015-08-08 MED ORDER — METHOCARBAMOL 500 MG PO TABS: 500.0000 mg | ORAL_TABLET | Freq: Four times a day (QID) | ORAL | Status: DC | PRN

## 2015-08-08 NOTE — Op Note (Signed)
Margaret Gentry, Margaret Gentry                ACCOUNT NO.:  [id number]  MEDICAL RECORD NO.:  [id number]  LOCATION:  5N19C                        FACILITY:  [facility]  PHYSICIAN:  Margaret Ano. Keaun Schnabel, M.D.DATE OF BIRTH:  06/17/1955  DATE OF PROCEDURE: DATE OF DISCHARGE:                              OPERATIVE REPORT   PREOPERATIVE DIAGNOSES:  Acute displaced distal radius fracture with impending compartment syndrome and acute carpal tunnel symptomatology with associated median nerve injury/contusion presumed.  POSTOPERATIVE DIAGNOSES:  Acute displaced distal radius fracture with impending compartment syndrome and acute carpal tunnel symptomatology with associated median nerve injury/contusion presumed.  PROCEDURE: 1. Open reduction and internal fixation of right radius fracture with     Biomet Cross lock plate and 2 mL of stay graft (allograft bone     graft secondary to metaphyseal defect). 2. AP lateral and oblique x-rays performed, examined and interpreted     by myself, right wrist and forearm. 3. Closed treatment of ulnar styloid fracture. 4. Open carpal tunnel release. 5. Fasciotomy, superficial and deep volar compartments of the forearm.  SURGEON:  Margaret Ano. Margaret Gentry, M.D.  ASSISTANT:  None.  COMPLICATIONS:  None.  ANESTHESIA:  General preoperative block.  TOURNIQUET TIME:  Less than an hour.  DRAINS:  One #7 TLS.  TOURNIQUET TIME:  Less than an hour.  INDICATIONS FOR THE PROCEDURE:  This patient is a 60 year old female, who presented to the emergency room.  She saw emergency room staff, complained of the known distal radius fracture as well as numbness and tingling.  The ER staff sedated her, performed a preliminary reduction and then called myself.  After calling me, I came to the patient's bedside promptly as the chart reflects and examined the patient.  She was in a severe amount of pain.  She could not move her fingers well, had numbness and tingling in the median nerve  distribution.  The ER physician states that he performed a hematoma block with sedation, but did not inject medicine around the carpal canal.  The patient notes that ________ but worsening numbness has developed after the reduction. Given this information, I told the patient that I would highly recommend prompt fixation and release of her compartments and carpal tunnel in an effort to try and give the nerves optimal environment to live throughout and survive.  With this information in mind, the patient consented to surgery.  OPERATIVE PROCEDURE:  The patient was seen by myself and Anesthesia, taken to the operative suite, underwent smooth induction of general anesthetic.  Preoperatively, she was given a block by Dr. Sheldon Silvan. Once in the operative theater, I performed a very careful Hibiclens pre- scrub followed by 10 minutes surgical Betadine scrub, followed by sterile draping.  I performed scrub by myself in an effort to not increase her comminution.  Following this, we performed very careful and cautious time-out.  Preoperative antibiotics were given in the form of 2 g of Ancef and the operation commenced with elevation of the tourniquet. Volar radial incision was made.  Dissection was carried down. Superficial and deep volar forearm compartments were released with scissor tip by myself without difficulty.  I was able to release the compartments nicely.  All muscles were viable.  Once this was complete, the FCR was retracted as was the carpal canal contents.  The pronator was incised and I set about reducing her fracture.  She had a large amount of metaphyseal comminution, 2 mL of stay graft was placed for purposes of filling the metaphyseal void.  I then reduced the fracture, held fixation and applied a DVR standard cross lock plate.  Adequate radial height, inclination, and volar tilt were obtained and plate had good sound fixation.  I performed AP, lateral, and oblique x-rays  which revealed the proper geometry and good plate fixation with proper screw length.  All parameters looked well.  Following this, I performed a live fluoroscopy.  Ulnar styloid fracture was noted that the TFC was stable as well as the distal radioulnar joint.  The scaphoid and lunate looked stable.  I was pleased with this. At this time, I irrigated copiously, closed the pronator and turned attention toward the carpal canal.  1 inch incision was made in the region of the carpal canal.  The palmar fascia was released.  Following release of the palmar fascia, I then very carefully released the transverse carpal ligament.  Fat pad egressed nicely distally.  I confirmed distal release, followed by distal and proximal dissection and direct release of the proximal leaflet.  This was done under direct vision with iatrogenic nerve injury.  There was some blood in the canal, which was evacuated and there were no complicating features.  Following this, we irrigated, deflated the tourniquet, placed a TLS drain in the main wound and closed this with Prolene of the 3-0 and 4-0 variety.  She tolerated this well.  Compartments were all soft.  Refill was excellent.  Carpal canal was closed as well.  I made sure hemostasis was excellent as possible and also placed a well- padded comfortable volar splint with a dorsal slab.  The patient tolerated this well.  There were no complicating features.  Following this, we discussed the operation with her family after she was transported to the recovery room.  We will plan for IV antibiotics, general postop pain management, and Carter arm pillow to elevate her maximum.  They understand the median nerve is in the optimal environment for healing.  They also understand that it may take quite a bit of time for the patient to realize meaningful nerve recovery.  I have discussed these issues with her at length and the findings.  At this juncture, she has good  stable sound fixation.  The nerve was in optimal environment.  I feel the nerve has had a contusive injury, which was observed intraoperatively by notable contusive region about the nerve and also feel the nerve had compression phenomenon.  Hopefully, the patient will recover swiftly.  She understands this as does her husband.  Pleasure to see her today and participate in her care.  Look forward to participate in her postop recovery.     Margaret AnoWilliam M. Margaret PeaGramig, M.D.     [facility] LLCWMG/MEDQ  D:  08/07/2015  T:  08/07/2015  Job:  [id number]

## 2015-08-08 NOTE — Discharge Instructions (Signed)
Keep bandage clean and dry.  Call for any problems.  No smoking.  Criteria for driving a car: you should be off your pain medicine for 7-8 hours, able to drive one handed(confident), thinking clearly and feeling able in your judgement to drive. Continue elevation as it will decrease swelling.  If instructed by MD move your fingers within the confines of the bandage/splint.  Use ice if instructed by your MD. Call immediately for any sudden loss of feeling in your hand/arm or change in functional abilities of the extremity. We recommend that you to take vitamin C 1000 mg a day to promote healing. We also recommend that if you require  pain medicine that you take a stool softener to prevent constipation as most pain medicines will have constipation side effects. We recommend either Peri-Colace or Senokot and recommend that you also consider adding MiraLAX as well to prevent the constipation affects from pain medicine if you are required to use them. These medicines are over the counter and may be purchased at a local pharmacy. A cup of yogurt and a probiotic can also be helpful during the recovery process as the medicines can disrupt your intestinal environment. In addition, please take vitamin B 6 200 mg daily.

## 2015-08-08 NOTE — Evaluation (Signed)
Occupational Therapy Evaluation Patient Details Name: Margaret Gentry MRN: [id number] DOB: 1955-04-19 Today's Date: 08/08/2015    History of Present Illness Open reduction and internal fixation of right radius fracture , R carpal tunnel release   Clinical Impression   Pt doing well. Pt requires set up - sup/min A with ADLs and pt is Independent with mobility. Pt's R UE immobilized and NWB through hand/wrist. Pt allowed ROM of R fingers, elbow and shoulder as tolerated. Pt educated on positioning of R UE in elevation for edema control and sling wear, dressing techniques. All education completed and no further Acute OT indicated at this time. Progress rehab of R wrist as ordered per MD when appropriate    Follow Up Recommendations  No OT follow up (progress rehab of R wrist as ordered per MD when appropriate)    Equipment Recommendations  None recommended by OT    Recommendations for Other Services       Precautions / Restrictions Precautions Precautions: Other (comment) (R wrist) Precaution Comments: NWB, R Wrist immobilized Required Braces or Orthoses: Sling;Other Brace/Splint Other Brace/Splint: soft cast/splint R wrist/forearm Restrictions Weight Bearing Restrictions: Yes RUE Weight Bearing: Non weight bearing Other Position/Activity Restrictions: ROM allowed in  elbow and shoulder, fingers. Keep R UE elevated when in bed or seated. Sling on when moving around      Mobility Bed Mobility Overal bed mobility: Independent                Transfers Overall transfer level: Independent                    Balance Overall balance assessment: No apparent balance deficits (not formally assessed)                                          ADL Overall ADL's : Needs assistance/impaired     Grooming: Wash/dry hands;Wash/dry face;Applying deodorant;Oral care;Set up;Supervision/safety   Upper Body Bathing: Set up;Supervision/ safety   Lower  Body Bathing: Supervison/ safety;Set up   Upper Body Dressing : Supervision/safety;Set up   Lower Body Dressing: Minimal assistance   Toilet Transfer: Independent   Toileting- Clothing Manipulation and Hygiene: Supervision/safety   Tub/ Shower Transfer: Independent   Functional mobility during ADLs: Independent       Vision  reading glasses, no change from baseline              Pertinent Vitals/Pain Pain Assessment: 0-10 Pain Score: 2  Pain Descriptors / Indicators: Aching;Sore Pain Intervention(s): Monitored during session;Repositioned     Hand Dominance Right   Extremity/Trunk Assessment Upper Extremity Assessment Upper Extremity Assessment: RUE deficits/detail RUE: Unable to fully assess due to immobilization       Cervical / Trunk Assessment Cervical / Trunk Assessment: Normal   Communication Communication Communication: No difficulties   Cognition Arousal/Alertness: Awake/alert Behavior During Therapy: WFL for tasks assessed/performed Overall Cognitive Status: Within Functional Limits for tasks assessed                     General Comments   Pt very pleasant and cooperative, husband very supportive    Exercises   Other Exercises Other Exercises: ROM finger, elbow and shoulder pt and spouse educated on sling wear/postioning and elevation of R UE for edema control         Home Living Family/patient expects to be discharged  to:: Private residence Living Arrangements: Spouse/significant other Available Help at Discharge: Family Type of Home: House Home Access: Stairs to enter Secretary/administratorntrance Stairs-Number of Steps: 2   Home Layout: One level     Bathroom Shower/Tub: Producer, television/film/videoWalk-in shower   Bathroom Toilet: Standard     Home Equipment: None          Prior Functioning/Environment Level of Independence: Independent             OT Diagnosis: Acute pain   OT Problem List: Impaired UE functional use;Pain;Decreased range of motion   OT  Treatment/Interventions:      OT Goals(Current goals can be found in the care plan section) Acute Rehab OT Goals Patient Stated Goal: go home today OT Goal Formulation: With patient/family  OT Frequency:     Barriers to D/C:  none                        End of Session Equipment Utilized During Treatment: Other (comment) (sling) Nurse Communication: Mobility status  Activity Tolerance: Patient tolerated treatment well Patient left: in bed;with call bell/phone within reach;with family/visitor present   Time: [id number] OT Time Calculation (min): 25 min Charges:  OT General Charges $OT Visit: 1 Procedure OT Evaluation $OT Eval Moderate Complexity: 1 Procedure OT Treatments $Therapeutic Exercise: 8-22 mins G-Codes:    Galen ManilaSpencer, Charon Akamine Jeanette 08/08/2015, 9:48 AM

## 2015-08-08 NOTE — Discharge Summary (Signed)
Physician Discharge Summary  Patient ID: Margaret Gentry MRN: [id number] DOB/AGE: [birth year] 60 y.o.  Admit date: 09-02-15 Discharge date:  08/08/2015  Admission Diagnoses: right wrist fracture with acute carpal tunnel syndrome Past Medical History  Diagnosis Date  . Thyroid disease     Discharge Diagnoses:  Active Problems:   Distal radius fracture   Surgeries: Procedure(s): OPEN REDUCTION INTERNAL FIXATION (ORIF) DISTAL RADIAL FRACTURE on 2015-09-02    Consultants:  none  Discharged Condition: Improved  Hospital Course: [name] is an 60 y.o. female who was admitted 09-02-2015 with a chief complaint of Chief Complaint  Patient presents with  . Wrist Injury  , and found to have a diagnosis of right wrist fracture with acute carpal tunnel syndrome.  They were brought to the operating room on 09-02-15 and underwent Procedure(s): OPEN REDUCTION INTERNAL FIXATION (ORIF) DISTAL RADIAL FRACTURE.    They were given perioperative antibiotics: Anti-infectives    Start     Dose/Rate Route Frequency Ordered Stop   09/02/15 2200  ceFAZolin (ANCEF) IVPB 1 g/50 mL premix     1 g 100 mL/hr over 30 Minutes Intravenous Every 8 hours Sep 02, 2015 1646     2015-09-02 1600  ceFAZolin (ANCEF) IVPB 1 g/50 mL premix     1 g 100 mL/hr over 30 Minutes Intravenous NOW Sep 02, 2015 1553 2015/09/02 1627   Sep 02, 2015 1556  ceFAZolin (ANCEF) 1-5 GM-% IVPB    Comments:  Citty, Teresa   : cabinet override      09/02/15 1556 08/08/15 0359    .  They were given sequential compression devices, early ambulation.  Recent vital signs: Patient Vitals for the past 24 hrs:  BP Temp Temp src Pulse Resp SpO2 Height Weight  08/08/15 0335 (!) 96/52 mmHg 98.6 F (37 C) Oral 66 15 96 % - -  02-Sep-2015 1943 (!) 98/46 mmHg - - 71 15 98 % - -  2015-09-02 1814 (!) 104/56 mmHg 97.9 F (36.6 C) Oral 67 15 100 % - -  09/02/15 1701 121/75 mmHg 97.9 F (36.6 C) Oral 80 16 100 %  (1.575 m) 72.576 kg (160 lb)   2015-09-02 1623 - 97.7 F (36.5 C) - 67 15 100 % - -  09-02-15 1618 122/76 mmHg - - 73 13 100 % - -  02-Sep-2015 1603 126/73 mmHg - - 81 18 100 % - -  09/02/2015 1600 - - - 85 14 100 % - -  September 02, 2015 1548 120/71 mmHg - - 80 15 98 % - -  September 02, 2015 1533 109/78 mmHg 97.6 F (36.4 C) - 94 18 96 % - -  Sep 02, 2015 1245 114/75 mmHg - - 61 11 99 % - -  2015-09-02 1200 133/78 mmHg - - (!) 57 18 100 % - -  2015/09/02 1155 134/76 mmHg - - 62 18 100 % - -  September 02, 2015 1150 - - - (!) 59 18 100 % - -  September 02, 2015 1145 148/78 mmHg - - - - - - -  Sep 02, 2015 1130 135/79 mmHg - - (!) 59 - 100 % - -  02-Sep-2015 1115 127/83 mmHg - - (!) 57 - 100 % - -  .  Recent laboratory studies: Dg Wrist 2 Views Right  September 02, 2015  CLINICAL DATA:  Post reduction of right wrist fracture. EXAM: RIGHT WRIST - 2 VIEW COMPARISON:  09-02-15 FINDINGS: There has been interval reduction of previously demonstrated severely comminuted intra-articular distal radial fracture. There is residual mild dorsal angulation of the  distal fracture fragment at the main fracture line. There is mild residual impaction. Due to severe comminution there is a butterfly fragment which is still displaced dorsally. There is an associated soft tissue swelling. Casting material overlies the wrist. IMPRESSION: Status post reduction of severely comminuted intra-articular distal radial fracture with residual mild dorsal angulation of the main fracture fragments, and dorsal displacement of a butterfly fragment. Mild residual impaction. Electronically Signed   By: Ted Mcalpineobrinka  Dimitrova M.D.   On: 08/07/2015 12:37   Dg Wrist 2 Views Right  08/07/2015  CLINICAL DATA:  Obvious deformity to the right wrist. Pt fell backwards today at home and tried to catch herself with her right hand. Most of her pain is on the lateral distal portion of the right wrist. EXAM: RIGHT WRIST - 2 VIEW COMPARISON:  None. FINDINGS: There is a comminuted displaced fracture of the distal radial metaphysis with  fractures extending to the distal radial articular surface. The comminuted fracture components have displaced posteriorly with the carpus posterior displacement is at least 12 mm. Fracture fragments have also displaced proximally by at least 1 cm. There are no other fractures. IMPRESSION: Comminuted and significantly displaced fracture of the distal right radial metaphysis and epiphysis as detailed above. Electronically Signed   By: Amie Portlandavid  Ormond M.D.   On: 08/07/2015 10:42    Discharge Medications:     Medication List    TAKE these medications        cephALEXin 500 MG capsule  Commonly known as:  KEFLEX     Estradiol-Norethindrone Acet 0.5-0.1 MG tablet     LINZESS 145 MCG Caps capsule  Generic drug:  linaclotide     methocarbamol 500 MG tablet  Commonly known as:  ROBAXIN  Take 1 tablet (500 mg total) by mouth every 6 (six) hours as needed for muscle spasms.     multivitamin with minerals tablet  Take 1 tablet by mouth daily.     oxyCODONE 5 MG immediate release tablet  Commonly known as:  Oxy IR/ROXICODONE  Take 1-2 tablets (5-10 mg total) by mouth every 3 (three) hours as needed for moderate pain.     levothyroxine 75 MCG tablet  Commonly known as:  SYNTHROID, LEVOTHROID  Take 75 mcg by mouth daily before breakfast.     SYNTHROID 50 MCG tablet  Generic drug:  levothyroxine        Diagnostic Studies: Dg Wrist 2 Views Right  08/07/2015  CLINICAL DATA:  Post reduction of right wrist fracture. EXAM: RIGHT WRIST - 2 VIEW COMPARISON:  08/07/2015 FINDINGS: There has been interval reduction of previously demonstrated severely comminuted intra-articular distal radial fracture. There is residual mild dorsal angulation of the distal fracture fragment at the main fracture line. There is mild residual impaction. Due to severe comminution there is a butterfly fragment which is still displaced dorsally. There is an associated soft tissue swelling. Casting material overlies the wrist.  IMPRESSION: Status post reduction of severely comminuted intra-articular distal radial fracture with residual mild dorsal angulation of the main fracture fragments, and dorsal displacement of a butterfly fragment. Mild residual impaction. Electronically Signed   By: Ted Mcalpineobrinka  Dimitrova M.D.   On: 08/07/2015 12:37   Dg Wrist 2 Views Right  08/07/2015  CLINICAL DATA:  Obvious deformity to the right wrist. Pt fell backwards today at home and tried to catch herself with her right hand. Most of her pain is on the lateral distal portion of the right wrist. EXAM: RIGHT WRIST - 2 VIEW  COMPARISON:  None. FINDINGS: There is a comminuted displaced fracture of the distal radial metaphysis with fractures extending to the distal radial articular surface. The comminuted fracture components have displaced posteriorly with the carpus posterior displacement is at least 12 mm. Fracture fragments have also displaced proximally by at least 1 cm. There are no other fractures. IMPRESSION: Comminuted and significantly displaced fracture of the distal right radial metaphysis and epiphysis as detailed above. Electronically Signed   By: Amie Portland M.D.   On: 08/07/2015 10:42    They benefited maximally from their hospital stay and there were no complications.     Disposition: Final discharge disposition not confirmed     Discharge Instructions    Call MD / Call 911    Complete by:  As directed   If you experience chest pain or shortness of breath, CALL 911 and be transported to the hospital emergency room.  If you develope a fever above 101 F, pus (white drainage) or increased drainage or redness at the wound, or calf pain, call your surgeon's office.     Constipation Prevention    Complete by:  As directed   Drink plenty of fluids.  Prune juice may be helpful.  You may use a stool softener, such as Colace (over the counter) 100 mg twice a day.  Use MiraLax (over the counter) for constipation as needed.     Diet - low  sodium heart healthy    Complete by:  As directed      Discharge instructions    Complete by:  As directed   Keep bandage clean and dry.  Call for any problems.  No smoking.  Criteria for driving a car: you should be off your pain medicine for 7-8 hours, able to drive one handed(confident), thinking clearly and feeling able in your judgement to drive. Continue elevation as it will decrease swelling.  If instructed by MD move your fingers within the confines of the bandage/splint.  Use ice if instructed by your MD. Call immediately for any sudden loss of feeling in your hand/arm or change in functional abilities of the extremity.We recommend that you to take vitamin C 1000 mg a day to promote healing. We also recommend that if you require  pain medicine that you take a stool softener to prevent constipation as most pain medicines will have constipation side effects. We recommend either Peri-Colace or Senokot and recommend that you also consider adding MiraLAX as well to prevent the constipation affects from pain medicine if you are required to use them. These medicines are over the counter and may be purchased at a local pharmacy. A cup of yogurt and a probiotic can also be helpful during the recovery process as the medicines can disrupt your intestinal environment. He is taking vitamin B 6 200 mg daily as well.     Increase activity slowly as tolerated    Complete by:  As directed           Follow-up Information    Follow up with Karen Chafe, MD In 2 weeks.   Specialty:  Orthopedic Surgery   Why:  For wound re-check, For suture removal   Contact information:   [address] [place] [address] [phone]        Signed: Sheran Lawless 08/08/2015, 8:29 AM

## 2015-08-08 NOTE — Progress Notes (Signed)
Reviewed discharge instructions/medications with patient and patient's husband.  Answered all their questions.

## 2015-08-21 ENCOUNTER — Encounter (HOSPITAL_BASED_OUTPATIENT_CLINIC_OR_DEPARTMENT_OTHER): Payer: Self-pay

## 2015-08-21 ENCOUNTER — Emergency Department (HOSPITAL_BASED_OUTPATIENT_CLINIC_OR_DEPARTMENT_OTHER)
Admission: EM | Admit: 2015-08-21 | Discharge: 2015-08-21 | Disposition: A | Discharge status: Home / Self Care | Payer: BLUE CROSS/BLUE SHIELD | Attending: Emergency Medicine | Admitting: Emergency Medicine

## 2015-08-21 DIAGNOSIS — T7840XA Allergy, unspecified, initial encounter: Secondary | ICD-10-CM | POA: Insufficient documentation

## 2015-08-21 DIAGNOSIS — L509 Urticaria, unspecified: Secondary | ICD-10-CM | POA: Diagnosis present

## 2015-08-21 DIAGNOSIS — Z79899 Other long term (current) drug therapy: Secondary | ICD-10-CM | POA: Diagnosis not present

## 2015-08-21 MED ORDER — FAMOTIDINE IN NACL 20-0.9 MG/50ML-% IV SOLN
20.0000 mg | Freq: Once | INTRAVENOUS | Status: AC
  Administered 2015-08-21: 20 mg via INTRAVENOUS
  Filled 2015-08-21: qty 50

## 2015-08-21 MED ORDER — HYDROXYZINE HCL 25 MG PO TABS: 25.0000 mg | ORAL_TABLET | Freq: Four times a day (QID) | ORAL | Status: DC | PRN

## 2015-08-21 MED ORDER — METHYLPREDNISOLONE SODIUM SUCC 125 MG IJ SOLR
125.0000 mg | Freq: Once | INTRAMUSCULAR | Status: AC
  Administered 2015-08-21: 125 mg via INTRAVENOUS
  Filled 2015-08-21: qty 2

## 2015-08-21 MED ORDER — DIPHENHYDRAMINE HCL 50 MG/ML IJ SOLN
25.0000 mg | Freq: Once | INTRAMUSCULAR | Status: AC
  Administered 2015-08-21: 25 mg via INTRAVENOUS
  Filled 2015-08-21: qty 1

## 2015-08-21 MED ORDER — DIPHENHYDRAMINE HCL 50 MG/ML IJ SOLN
50.0000 mg | Freq: Once | INTRAMUSCULAR | Status: AC
  Administered 2015-08-21: 50 mg via INTRAVENOUS
  Filled 2015-08-21: qty 1

## 2015-08-21 MED ORDER — HYDROXYZINE HCL 25 MG PO TABS
25.0000 mg | ORAL_TABLET | Freq: Once | ORAL | Status: AC
  Administered 2015-08-21: 25 mg via ORAL
  Filled 2015-08-21: qty 1

## 2015-08-21 NOTE — Discharge Instructions (Signed)

## 2015-08-21 NOTE — ED Notes (Addendum)
C/o itching scalp, bumps to head x 2 weeks-?reaction to vit b6-took prednisone po-reaction better-restarted vitmin x 1 tab-scattered hives since yesterday-received prednisone inj yesterday-hives no better-NAD-steady gait

## 2015-08-21 NOTE — ED Notes (Signed)
Pt with raised reddened areas noted to left posterior thigh and left wrist, md aware, medication given

## 2015-08-21 NOTE — ED Provider Notes (Signed)
CSN: [id number]     Arrival date & time 08/21/15  1701 History  By signing my name below, I, Margaret Gentry, attest that this documentation has been prepared under the direction and in the presence of Loren Raceravid Blakelee Allington, MD. Electronically Signed: Alyssa GroveMartin Gentry, ED Scribe. 08/21/2015. 6:17 PM.   Chief Complaint  Patient presents with  . Urticaria    The history is provided by the patient. No language interpreter was used.    HPI Comments: Margaret Gentry is a 60 y.o. female with PMHx of Thyroid disease who presents to the Emergency Department complaining of gradual onset, gradual worsening itching rash. Patient has had intermittent diffuse hives since being discharged from the hospital roughly 2 weeks ago after having ORIF of right distal radius. She was started on vitamin C and B6 at that time to promote bone healing. States she noticed development of hives shortly after discharge. Was seen by her primary physician and started on by mouth prednisone and Benadryl. Patient took 3 days of the prednisone improvement of her rash. She didn't developed epigastric pain and nausea and vomiting. Was started on Zantac with improvement of his symptoms. Patient then developed diffuse hives roughly one hour after taking vitamin C and B6 to days ago. Was given an injection of Decadron at the urgent care clinic with little improvement. She's had continued itching and progression of rash. She denies any difficulty breathing or swallowing. She's had no other new exposures including detergents, soaps, foods or medications.   Past Medical History  Diagnosis Date  . Thyroid disease    Past Surgical History  Procedure Laterality Date  . Wrist surgery    . Open reduction internal fixation (orif) distal radial fracture Right 08/07/2015    Procedure: OPEN REDUCTION INTERNAL FIXATION (ORIF) DISTAL RADIAL FRACTURE;  Surgeon: Dominica SeverinWilliam Gramig, MD;  Location: [facility] OR;  Service: Orthopedics;  Laterality: Right;   No family  history on file. Social History  Substance Use Topics  . Smoking status: Never Smoker   . Smokeless tobacco: Never Used  . Alcohol Use: No   OB History    No data available     Review of Systems  Constitutional: Negative for fever and chills.  HENT: Negative for facial swelling.   Respiratory: Negative for shortness of breath.   Cardiovascular: Negative for chest pain.  Gastrointestinal: Positive for nausea, vomiting and abdominal pain. Negative for diarrhea and constipation.  Musculoskeletal: Negative for myalgias, back pain, neck pain and neck stiffness.  Skin: Positive for color change and rash.  Neurological: Negative for dizziness, weakness, light-headedness, numbness and headaches.  All other systems reviewed and are negative.   Allergies  Review of patient's allergies indicates no known allergies.  Home Medications   Prior to Admission medications   Medication Sig Start Date End Date Taking? Authorizing Provider  HydrOXYzine Pamoate (VISTARIL PO) Take by mouth.   Yes Historical Provider, MD  RaNITidine HCl (ZANTAC PO) Take by mouth.   Yes Historical Provider, MD  Estradiol-Norethindrone Acet 0.5-0.1 MG per tablet  12/27/12   Historical Provider, MD  hydrOXYzine (ATARAX/VISTARIL) 25 MG tablet Take 1 tablet (25 mg total) by mouth every 6 (six) hours as needed for anxiety or itching. 08/21/15   Loren Raceravid Moxon Messler, MD  levothyroxine (SYNTHROID, LEVOTHROID) 75 MCG tablet Take 75 mcg by mouth daily before breakfast.    Historical Provider, MD  SYNTHROID 50 MCG tablet  12/27/12   Historical Provider, MD   BP 101/66 mmHg  Pulse 78  Temp(Src)  98.6 F (37 C) (Oral)  Resp 16  Ht 5\' 2"  (1.575 m)  Wt 135 lb (61.236 kg)  BMI 24.69 kg/m2  SpO2 100% Physical Exam  Constitutional: She is oriented to person, place, and time. She appears well-developed and well-nourished. No distress.  HENT:  Head: Normocephalic and atraumatic.  Mouth/Throat: Oropharynx is clear and moist. No  oropharyngeal exudate.  No intraoral swelling or rash noted.  Eyes: EOM are normal. Pupils are equal, round, and reactive to light.  Neck: Normal range of motion. Neck supple.  Cardiovascular: Normal rate and regular rhythm.  Exam reveals no gallop and no friction rub.   No murmur heard. Pulmonary/Chest: Effort normal and breath sounds normal. No stridor. No respiratory distress. She has no wheezes. She has no rales. She exhibits no tenderness.  Abdominal: Soft. Bowel sounds are normal. She exhibits no distension and no mass. There is no tenderness. There is no rebound and no guarding.  Musculoskeletal: Normal range of motion. She exhibits no edema or tenderness.  Neurological: She is alert and oriented to person, place, and time.  Moves all extremities without deficit. Sensation is intact.  Skin: Skin is warm and dry. No rash noted. No erythema.  Erythematous papules and plaques to bilateral upper extremities, trunk, lower extremities, neck and scalp.  Psychiatric: She has a normal mood and affect. Her behavior is normal.  Nursing note and vitals reviewed.   ED Course  Procedures (including critical care time)  DIAGNOSTIC STUDIES: Oxygen Saturation is 100% on RA, normal by my interpretation.    COORDINATION OF CARE: 5:47 PM Discussed treatment plan with pt at bedside which includes injection Methylprednisolone, injection Diphenhydramine, and IV Famotidine and pt agreed to plan.   EKG Interpretation None      MDM   Final diagnoses:  Allergic reaction, initial encounter    I personally performed the services described in this documentation, which was scribed in my presence. The recorded information has been reviewed and is accurate.    Patient's rash is improved. Redness has lessened. Airway remains intact. Vital signs are stable.  Rash and itching have continued to improve especially after Vistaril. Patient's vital signs remained stable. She is observed in the department  roughly 5 hours. There is no airway compromise. Advised starting Claritin or Zyrtec in addition to her Zantac and Vistaril. She also is advised to follow-up with an allergist. Return precautions given.  Loren Raceravid Lazar Tierce, MD 08/21/15 2329

## 2015-08-24 ENCOUNTER — Ambulatory Visit (INDEPENDENT_AMBULATORY_CARE_PROVIDER_SITE_OTHER): Payer: BLUE CROSS/BLUE SHIELD | Admitting: Allergy and Immunology

## 2015-08-24 ENCOUNTER — Encounter: Payer: Self-pay | Admitting: Allergy and Immunology

## 2015-08-24 VITALS — BP 100/70 | HR 70 | Temp 98.1°F | Resp 17 | Ht 62.0 in | Wt 144.4 lb

## 2015-08-24 DIAGNOSIS — E039 Hypothyroidism, unspecified: Secondary | ICD-10-CM | POA: Insufficient documentation

## 2015-08-24 DIAGNOSIS — L509 Urticaria, unspecified: Secondary | ICD-10-CM

## 2015-08-24 DIAGNOSIS — J310 Chronic rhinitis: Secondary | ICD-10-CM | POA: Diagnosis not present

## 2015-08-24 DIAGNOSIS — E559 Vitamin D deficiency, unspecified: Secondary | ICD-10-CM | POA: Insufficient documentation

## 2015-08-24 MED ORDER — FAMOTIDINE 40 MG PO TABS: ORAL_TABLET | ORAL | Status: DC

## 2015-08-24 MED ORDER — HYDROXYZINE HCL 10 MG PO TABS: 10.0000 mg | ORAL_TABLET | Freq: Three times a day (TID) | ORAL | Status: DC | PRN

## 2015-08-24 NOTE — Patient Instructions (Signed)
Take Home Sheet  1. Avoidance: of medications as previously.   2. Antihistamine: Zyrtec 10mg  by mouth twice daily for runny nose or itching.    If needed Use Atarax 10mg  twice daily as needed.      3. Nasal Spray: Saline 2 spray(s) each nostril twice daily for stuffy nose or drainage.    4. Prednisone 30mg  once daily for 2 days, then 20mg  once daily for 2 days, then 10mg  once daily for 2 days and 5mg  once daily for 2 days.    5.  Take Pepcid 40mg  once to twice daily.  6.  Keep written diary of any new episodes, take picture etc. as discussed.   7. Follow up Visit: Tuesday at 9am in [place].   Websites that have reliable Patient information: 1. American Academy of Asthma, Allergy, & Immunology: www.aaaai.org 2. [employer]: www.foodallergy.org 3. [employer]s of Asthmatics: www.aanma.org 4. [facility]: https://www.strong.com/www.njc.org 5. American College of Allergy, Asthma, & Immunology: [employer].iswww.allergy.[employer].edu or www.acaai.org

## 2015-08-27 ENCOUNTER — Encounter: Payer: Self-pay | Admitting: Allergy and Immunology

## 2015-08-27 NOTE — Progress Notes (Signed)
NEW PATIENT NOTE  RE: Margaret Gentry MRN: [id number] DOB: 09-28-1955 [facility] [place] [address] [place] [address] Date of Office Visit: 08/24/2015  Dear Hope Pigeon, PA-C:  I had the pleasure of seeing Margaret Gentry today in initial evaluation, as you recall-- Subjective:  Margaret Gentry is a 60 y.o. female who presents today for Urticaria  Assessment:   1. Hives, acute recurring, unclear etiology.   2. 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   3.      Post-op from right radial fracture--open reduction and internal fixation (Titanium). 4.      History of hypothyroidism--maintained on Synthroid. 5.      Possible post-operative etiology to #1, less likely medication or ingestion relationship. Plan:   Meds ordered this encounter  Medications  . hydrOXYzine (ATARAX/VISTARIL) 10 MG tablet    Sig: Take 1 tablet (10 mg total) by mouth 3 (three) times daily as needed.    Dispense:  70 tablet    Refill:  0  . famotidine (PEPCID) 40 MG tablet    Sig: Take 1 tablet 1-2 times daily    Dispense:  60 tablet    Refill:  1  1. Avoidance: of medications/supplements as previously. 2. Antihistamine: Zyrtec  by mouth twice daily for runny nose or itching.    If needed Use Atarax  twice daily as needed. 3. Nasal Spray: Saline 2 spray(s) each nostril twice daily for stuffy nose or drainage.  4. Prednisone  once daily for 2 days, then  once daily for 2 days, then  once daily for 2 days and  once daily for 2 days.  5.  Begin Pepcid  once to twice daily. 6.  Keep written diary of any new episodes, take picture etc. as discussed. 7. Follow up Visit: Tuesday at 9am in [place], assess need for further evaluation/testing.  HPI: Margaret Gentry presents to the office in initial evaluation of hives.  She reports being in her usual state of good health, on daily Synthroid, until recent right radius fracture and surgical  repair approximately 17 days ago.  She had procedure without incident and 2 days of narcotic pain medication regime, followed by 2 days of Ibuprofen use.  Approximately post-op day #4 she noted scalp itching followed by scalp hive-like areas, which worsened over a several day period with unclear trigger.  She discontinued all pain meds and approximately 8 days post-op saw primary MD, given increasing hives at left arm/feet/thighs and back.  She was prescribed Prednisone and hydroxyzine with improvement but had increasing symptoms about one hour after taking Vit B6/Vit C, a few days into Prednisone course.  With persisting hives and then stomach upset she saw Urgent Care physician who added Zantac and Dexamethasone.  She improved within 24 hours but since has had continued fluctuating migrating hive areas over the last 4 days.  There has been one episode of lip swelling, but no tongue/throat changes, dysphagia or difficulty in breathing. She has maintained on Zantac and  hydroxyzine without consistent improvement.  She is eating typical foods usually chicken/veggie (as husband avoids meat due to Alpha gal history).  She has not noted specific worsening with activity, ingestion or exposure.  She denies any tick bite history.     She has used Aveeno only recently and spent much of her post-op time indoors.  She has been caring for their daughter's dog in the last month, but usually has a dog at home  as well.  And typically describes spring season nasal congestion, sneezing, rhinorrhea--Zyrtec is beneficial,  without chest symptoms (nor history of bronchitis or asthma).  Denies antibiotic courses. She had follow-up with Orthopedics who removed and returned a new cast--and are pleased with healing and surgical site--reporting no hives to the right forearm.    Medical History: Past Medical History  Diagnosis Date  . Thyroid disease    Surgical History: Past Surgical History  Procedure Laterality Date  .  Wrist surgery    . Open reduction internal fixation (orif) distal radial fracture Right 08/07/2015    Procedure: OPEN REDUCTION INTERNAL FIXATION (ORIF) DISTAL RADIAL FRACTURE;  Surgeon: Dominica SeverinWilliam Gramig, MD;  Location: [facility] OR;  Service: Orthopedics;  Laterality: Right;   Family History: Family History  Problem Relation Age of Onset  . Angioedema Neg Hx   . Eczema Neg Hx   . Immunodeficiency Neg Hx   . Urticaria Neg Hx   . Diabetes Mellitus II Father   . Allergic rhinitis Son   . Asthma Son   . Allergic rhinitis Son    Social History: Social History  . Marital Status: Married    Spouse Name: N/A  . Number of Children: 3  . Years of Education: N/A   Social History Main Topics  . Smoking status: Never Smoker   . Smokeless tobacco: Never Used  . Alcohol Use: No  . Drug Use: No  . 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  Margaret Gentry has a current medication list which includes the following prescription(s): calcium carbonate, levothyroxine, ranitidine hcl and hydroxyzine.   Drug Allergies: No Known Allergies  Environmental History: Margaret Gentry lives in a 58 year old house since built with wood/carpet floors, with central heat and air; stuffed mattress, non-feather pillow/comforter without humidifier or smokers.   Review of Systems  Constitutional: Negative for fever, chills, weight loss and malaise/fatigue.  HENT: Positive for congestion. Negative for ear pain, hearing loss, nosebleeds and sore throat.   Eyes: Negative for blurred vision, double vision, discharge and redness.       Reading glasses.  Respiratory: Negative for cough, shortness of breath and wheezing.        Denies history of bronchitis and pneumonia.  Gastrointestinal: Positive for heartburn (currently with prednisone use.). Negative for nausea, vomiting, abdominal pain, diarrhea and constipation.  Genitourinary: Negative.    Musculoskeletal: Negative for myalgias and joint pain.  Skin: Positive for itching and rash.  Neurological: Negative.  Negative for dizziness, seizures, weakness and headaches.  Endo/Heme/Allergies: Positive for environmental allergies.       Denies sensitivity to aspirin, NSAIDs, stinging insects, foods, latex and cosmetics.  Avoids costume jewelry.  Immunological: No chronic or recurring infections. Objective:   Filed Vitals:   08/24/15 1352  BP: 100/70  Pulse: 70  Temp: 98.1 F (36.7 C)  Resp: 17   SpO2 Readings from Last 1 Encounters:  08/24/15 99%   Physical Exam  Constitutional: She is well-developed, well-nourished, and in no distress.  HENT:  Head: Atraumatic.  Right Ear: Tympanic membrane and ear canal normal.  Left Ear: Tympanic membrane and ear canal normal.  Nose: Mucosal edema present. No rhinorrhea. No epistaxis.  Mouth/Throat: Oropharynx is clear and moist and mucous membranes are normal. No oropharyngeal exudate, posterior oropharyngeal edema or posterior oropharyngeal erythema.  Eyes: Conjunctivae are normal.  Neck: Neck supple.  Cardiovascular: Normal rate, S1 normal and  S2 normal.   No murmur heard. Pulmonary/Chest: Effort normal. She has no wheezes. She has no rhonchi. She has no rales.  Abdominal: Soft. Normal appearance and bowel sounds are normal.  Musculoskeletal: She exhibits no edema.  Lymphadenopathy:    She has no cervical adenopathy.  Neurological: She is alert.  Skin: Skin is warm and intact. Rash noted. Rash is urticarial (blanching erythematous circular urticarial lesions at her inner thighs, left upper arm, top of feet). No cyanosis. Nails show no clubbing.   Diagnostics:   Skin testing:  Deferred.    Maguire Sime M. Willa Rough, MD   cc: Gaye Alken, MD

## 2015-08-30 ENCOUNTER — Ambulatory Visit (INDEPENDENT_AMBULATORY_CARE_PROVIDER_SITE_OTHER): Payer: BLUE CROSS/BLUE SHIELD | Admitting: Allergy and Immunology

## 2015-08-30 ENCOUNTER — Encounter: Payer: Self-pay | Admitting: Allergy and Immunology

## 2015-08-30 VITALS — BP 100/70 | HR 72 | Temp 98.7°F | Resp 16

## 2015-08-30 DIAGNOSIS — J310 Chronic rhinitis: Secondary | ICD-10-CM

## 2015-08-30 DIAGNOSIS — L509 Urticaria, unspecified: Secondary | ICD-10-CM | POA: Diagnosis not present

## 2015-08-30 NOTE — Patient Instructions (Addendum)
   Take picture and document environment, exposure, ingestion, activity with any new episodes of hives.  Continue with the  avoidance of any additional supplements.  [facility] in the next 2 weeks.  Prednisone 5 mg today and on Thursday.  Continue Zyrtec and Pepcid twice daily.  Hydroxyzine as needed.  In one week, may decrease Zyrtec to once daily as long as symptom-free.  Avoid NSAIDs   As previously discussed.  Consider selected aeroallergen and food testing.  Consider in office Vitamin C/B-6 challenge.  Follow-up with Dr. Delorse LekPadgett in 1-2 months or sooner if needed.

## 2015-08-30 NOTE — Progress Notes (Signed)
     FOLLOW UP NOTE  RE: Margaret Gentry MRN: [id number] DOB: 08/30/1955 [facility] [address] [place], [place] [address] Date of Office Visit: 08/30/2015  Subjective:  Margaret DomMarybeth B Gentry is a 60 y.o. female who presents today for Urticaria  Assessment:   1. Recurring hives, improved--unclear etiology.   2. Chronic rhinitis.    Plan:  1.    Take picture and document environment, exposure, ingestion, activity with any new episodes of hives. 2.    Continue with the avoidance of any additional oral supplements. 3.    [facility]bt[facility] selected [facility] at [facility] in the next 2 weeks, given recurring episodes without clear trigger. 4.    Prednisone 5 mg today and on Thursday. 5.    Continue Zyrtec and Pepcid twice daily. 6.    Hydroxyzine as needed. 7.    In one week, may decrease Zyrtec to once daily as long as symptom-free. 8.    Avoid NSAIDs as previously discussed. 9.    Consider selected aeroallergen and food testing. 10.  Consider in office Vitamin C/B-6 challenge. 11.  Follow-up with Dr. Delorse LekPadgett in 1-2 months or sooner if needed.  HPI: Margaret Gentry returns to the office in follow-up of her hives.  She has had significant improvement since her visit last week and tolerating Prednisone 10mg  yesterday.  She has noted a few areas at left arm and inner thighs  without feet, scalp or palm changes.  No swelling, change in breathing, throat complaints or dysphagia.  She has not noted any specific trigger for increase in hives but today reports very few red areas.  It seems skin changes are more flat red splotches without raised texture.  Her appetite and activity are normal and she had follow-up with Orthopedics, Dr. Damita DunningsW. Graming, who removed the cast and is very pleased with healing.  There are no skin changes or itching at the surgical forearm area.  She has used hydroxyzine fairly infrequently(last 2 days ago) and is sleeping without concerns, except slight burping.  Denies ED  or urgent care visits or antibiotic courses.  Margaret Gentry has a current medication list which includes the following prescription(s): calcium carbonate, famotidine, levothyroxine and hydroxyzine.   Drug Allergies: No Known Allergies  Objective:   Filed Vitals:   08/30/15 0913  BP: 100/70  Pulse: 72  Temp: 98.7 F (37.1 C)  Resp: 16   SpO2 Readings from Last 1 Encounters:  08/30/15 98%   Physical Exam  Constitutional: She is well-developed, well-nourished, and in no distress.  HENT:  Head: Atraumatic.  Right Ear: Tympanic membrane and ear canal normal.  Left Ear: Tympanic membrane and ear canal normal.  Nose: Mucosal edema present. No rhinorrhea. No epistaxis.  Mouth/Throat: Oropharynx is clear and moist and mucous membranes are normal. No oropharyngeal exudate, posterior oropharyngeal edema or posterior oropharyngeal erythema.  Neck: Neck supple.  Cardiovascular: Normal rate, S1 normal and S2 normal.   No murmur heard. Pulmonary/Chest: Effort normal. She has no wheezes. She has no rhonchi. She has no rales.  Lymphadenopathy:    She has no cervical adenopathy.  Skin: Skin is warm and intact. No cyanosis. Nails show no clubbing.  Very few tiny erythematous macular areas at left forearm only without dermatographia.  No bruising or other skin changes. Surgical site--right arm clear without any redness.     Holland Kotter M. Willa RoughHicks, MD  cc: Gaye AlkenBARNES,ELIZABETH STEWART, MD

## 2015-09-13 LAB — CBC WITH DIFFERENTIAL/PLATELET
Basophils Absolute: 0 cells/uL (ref 0–200)
Basophils Relative: 0 %
EOS PCT: 1 %
Eosinophils Absolute: 76 cells/uL (ref 15–500)
HCT: 38.3 % (ref 35.0–45.0)
HEMOGLOBIN: 12.5 g/dL (ref 11.7–15.5)
LYMPHS ABS: 1748 {cells}/uL (ref 850–3900)
Lymphocytes Relative: 23 %
MCH: 31.3 pg (ref 27.0–33.0)
MCHC: 32.6 g/dL (ref 32.0–36.0)
MCV: 96 fL (ref 80.0–100.0)
MONOS PCT: 7 %
MPV: 9.5 fL (ref 7.5–12.5)
Monocytes Absolute: 532 cells/uL (ref 200–950)
NEUTROS ABS: 5244 {cells}/uL (ref 1500–7800)
Neutrophils Relative %: 69 %
PLATELETS: 261 10*3/uL (ref 140–400)
RBC: 3.99 MIL/uL (ref 3.80–5.10)
RDW: 13.2 % (ref 11.0–15.0)
WBC: 7.6 10*3/uL (ref 3.8–10.8)

## 2015-09-14 LAB — COMPLETE METABOLIC PANEL WITH GFR
ALBUMIN: 4.1 g/dL (ref 3.6–5.1)
ALK PHOS: 52 U/L (ref 33–130)
ALT: 14 U/L (ref 6–29)
AST: 17 U/L (ref 10–35)
BUN: 14 mg/dL (ref 7–25)
CALCIUM: 9.2 mg/dL (ref 8.6–10.4)
CHLORIDE: 102 mmol/L (ref 98–110)
CO2: 23 mmol/L (ref 20–31)
Creat: 0.89 mg/dL (ref 0.50–1.05)
GFR, EST NON AFRICAN AMERICAN: 72 mL/min (ref >=60)
GFR, Est African American: 83 mL/min (ref >=60)
Glucose, Bld: 90 mg/dL (ref 65–99)
POTASSIUM: 3.8 mmol/L (ref 3.5–5.3)
Sodium: 138 mmol/L (ref 135–146)
Total Bilirubin: 0.5 mg/dL (ref 0.2–1.2)
Total Protein: 6.6 g/dL (ref 6.1–8.1)

## 2015-09-14 LAB — IGE: IGE (IMMUNOGLOBULIN E), SERUM: 48 kU/L (ref <115)

## 2015-09-14 LAB — ANA: ANA: NEGATIVE

## 2015-09-14 LAB — THYROID PEROXIDASE ANTIBODY: Thyroperoxidase Ab SerPl-aCnc: 195 IU/mL — ABNORMAL HIGH (ref <9)

## 2015-09-14 LAB — THYROGLOBULIN LEVEL: Thyroglobulin: 2.9 ng/mL (ref 2.8–40.9)

## 2015-09-16 LAB — ALPHA-GAL PANEL
ALLERGEN, PORK, F26: 0.19 kU/L — AB
Beef: 0.28 kU/L — ABNORMAL HIGH
Galactose-alpha-1,3-galactose IgE: 1.38 kU/L — ABNORMAL HIGH (ref <0.35)

## 2015-09-16 LAB — TRYPTASE: TRYPTASE: 6.7 ug/L (ref <11)

## 2015-09-16 LAB — COMPLEMENT, TOTAL: Compl, Total (CH50): 60 U/mL (ref 31–60)

## 2015-09-22 ENCOUNTER — Ambulatory Visit (INDEPENDENT_AMBULATORY_CARE_PROVIDER_SITE_OTHER): Payer: BLUE CROSS/BLUE SHIELD | Admitting: Allergy

## 2015-09-22 ENCOUNTER — Encounter: Payer: Self-pay | Admitting: Allergy

## 2015-09-22 VITALS — BP 120/80 | HR 61 | Temp 98.5°F | Resp 16

## 2015-09-22 DIAGNOSIS — Z91018 Allergy to other foods: Secondary | ICD-10-CM | POA: Diagnosis not present

## 2015-09-22 DIAGNOSIS — T783XXD Angioneurotic edema, subsequent encounter: Secondary | ICD-10-CM

## 2015-09-22 DIAGNOSIS — L501 Idiopathic urticaria: Secondary | ICD-10-CM

## 2015-09-22 MED ORDER — HYDROXYZINE HCL 25 MG PO TABS: ORAL_TABLET | ORAL | 5 refills | Status: DC

## 2015-09-22 MED ORDER — EPINEPHRINE 0.3 MG/0.3ML IJ SOAJ: 0.3000 mg | Freq: Once | INTRAMUSCULAR | 2 refills | Status: DC

## 2015-09-22 MED ORDER — FAMOTIDINE 40 MG PO TABS: ORAL_TABLET | ORAL | 5 refills | Status: DC

## 2015-09-22 NOTE — Patient Instructions (Addendum)
1. Idiopathic urticaria Improved however still with breakthrough hives. Continue Zyrtec 10mg  twice a day, Pepcid twice a day and hydroxyzine as needed.  Labwork done for urticaria unremarkable except for elevated TPO ab in setting of thyroid disease and detectable levels for alpha-gal, pork and beef.  With no clinical history of red meat ingestion followed by delayed allergic reaction less likely that she has true IgE mediated food allergy to red meats.  Will prescribe epipen and demonstrated training today.  Advised proceed cautiously with red meat ingestion and notify office if she does have any delayed-type reactions.   Will try to avoid prednisone per patient due to side effects.    2. Angioedema, subsequent encounter Plan as above.    3. Food allergy Possible alpha-gal allergy with detectable levels however no clinical history suggestive of true IgE mediated food allergy.  Plan as above.    Follow-up 57mo prior to going abroad

## 2015-09-22 NOTE — Progress Notes (Signed)
New Patient Note  RE: Margaret Gentry MRN: [id number] DOB: [birth year] Date of Office Visit: 09/22/2015   History of present illness: Margaret Gentry is a 60 y.o. female presenting today for follow-up of urticaria/angioedema.    She was last seen in July 2017 at which time she was improved and recommended decreasing to daily dose of Zyrtec.  She was able to go camping in July and reports she was hive free until several days ago when she have some breakthrough hives on her face.  She then increased back up to Zyrtec BID dosing and has not had any further hives or swelling.  She also continues to take pepcid BID to help with reflux that started after prednisone use.      She had thorough labwork up for underlying causes of urticaria (see below) which was positive for alpha-gal, pork and beef as well as elevated TPO ab for which she has history of thyroid disease.   She does not feel she has been bitten by any ticks in the past.  Also reports eating pork, beef and other red meats previously without any immediate or delayed-type reactions.  Recalls in last couple weeks eating a fast food hamburger with no issue.     She is planning to leave for a [place]ise in early November.    Review of systems: Review of Systems  Constitutional: Negative.   HENT: Negative.   Eyes: Negative.   Respiratory: Negative.   Cardiovascular: Negative.   Gastrointestinal: Positive for heartburn.  Neurological: Negative.   All other systems reviewed and are negative.   Past medical/social/surgical/family history have been reviewed and are unchanged unless specifically indicated below.  No changes  Medication List:   Medication List       Accurate as of 09/22/15 11:49 AM. Always use your most recent med list.          calcium carbonate 500 MG chewable tablet Commonly known as:  TUMS - dosed in mg elemental calcium Chew 1 tablet by mouth 3 (three) times daily with meals.   famotidine 40 MG  tablet Commonly known as:  PEPCID Take 1 tablet 1-2 times daily   hydrOXYzine 25 MG tablet Commonly known as:  ATARAX/VISTARIL   levothyroxine 75 MCG tablet Commonly known as:  SYNTHROID, LEVOTHROID Take 75 mcg by mouth daily before breakfast.   methocarbamol 500 MG tablet Commonly known as:  ROBAXIN   ondansetron 8 MG disintegrating tablet Commonly known as:  ZOFRAN-ODT   oxyCODONE 5 MG immediate release tablet Commonly known as:  Oxy IR/ROXICODONE       Known medication allergies: No Known Allergies   Physical examination: Blood pressure 120/80, pulse 61, temperature 98.5 F (36.9 C), temperature source Oral, resp. rate 16, SpO2 99 %.  General: Alert, interactive, in no acute distress, rt wrist in splint HEENT: TMs pearly gray, turbinates non-edematous without discharge, post-pharynx unremarkable. Neck: Supple without lymphadenopathy. Lungs: Clear to auscultation without wheezing, rhonchi or rales. CV: Normal S1, S2 without murmurs. Abdomen: Nondistended, nontender. Skin: Warm and dry, without lesions or rashes. No urticarial lesions today Extremities:  No clubbing, cyanosis or edema. Neuro:   Grossly intact.  Diagnositics/Labs: Labs:  Component     Latest Ref Rng & Units 09/13/2015  WBC     3.8 - 10.8 K/uL 7.6  RBC     3.80 - 5.10 MIL/uL 3.99  Hemoglobin     11.7 - 15.5 g/dL 12.5  HCT     35.0 -  45.0 % 38.3  MCV     80.0 - 100.0 fL 96.0  MCH     27.0 - 33.0 pg 31.3  MCHC     32.0 - 36.0 g/dL 32.6  RDW     11.0 - 15.0 % 13.2  Platelets     140 - 400 K/uL 261  MPV     7.5 - 12.5 fL 9.5  NEUT#     1,500 - 7,800 cells/uL 5,244  Lymphocyte #     850 - 3,900 cells/uL 1,748  Monocyte #     200 - 950 cells/uL 532  Eosinophils Absolute     15 - 500 cells/uL 76  Basophils Absolute     0 - 200 cells/uL 0  Neutrophils     % 69  Lymphocytes     % 23  Monocytes Relative     % 7  Eosinophil     % 1  Basophil     % 0  Smear Review      Criteria  for review not met  Sodium     135 - 146 mmol/L 138  Potassium     3.5 - 5.3 mmol/L 3.8  Chloride     98 - 110 mmol/L 102  CO2     20 - 31 mmol/L 23  Glucose     65 - 99 mg/dL 90  BUN     7 - 25 mg/dL 14  Creatinine     0.50 - 1.05 mg/dL 0.89  Total Bilirubin     0.2 - 1.2 mg/dL 0.5  Alkaline Phosphatase     33 - 130 U/L 52  AST     10 - 35 U/L 17  ALT     6 - 29 U/L 14  Total Protein     6.1 - 8.1 g/dL 6.6  Albumin     3.6 - 5.1 g/dL 4.1  Calcium     8.6 - 10.4 mg/dL 9.2  GFR, Est African American     >=60 mL/min 83  GFR, Est Non African American     >=60 mL/min 72  Beef     kU/L 0.28 (H)  Allergen, Pork, f26     kU/L 0.19 (H)  Allergen, Mutton, f88     kU/L <0.10  Galactose-alpha-1,3-galactose IgE     <0.35 kU/L 1.38 (H)  Tryptase     <11 ug/L 6.7  IgE (Immunoglobulin E), Serum     <115 kU/L 48  Thyroperoxidase Ab SerPl-aCnc     <9 IU/mL 195 (H)  Thyroglobulin     2.8 - 40.9 ng/mL 2.9  Anit Nuclear Antibody(ANA)     NEGATIVE NEG  Compl, Total (CH50)     31 - 60 U/mL 60    Assessment and plan:   1. Idiopathic urticaria Improved however still with breakthrough hives. Continue Zyrtec 41m twice a day, Pepcid twice a day and hydroxyzine as needed.   Continue avoidance of NSAIDs as previously discussed.   Labwork done for urticaria unremarkable except for elevated TPO ab in setting of thyroid disease and detectable sIgE levels for alpha-gal, pork and beef.  With no clinical history of red meat ingestion followed by immediate or delayed allergic reaction less likely that she has true IgE mediated food allergy to red meats.  Will prescribe epipen and demonstrated training today.  Advised proceed cautiously with red meat ingestion and notify office if she does have any reactions at which point would recommend avoidance.  Discussed keeping food journal with onset of hives to identify any potential triggers.  Would avoid prednisone as much as possible due to side  effects per patient.    2. Angioedema, subsequent encounter Plan as above.    3. Food allergy Possible alpha-gal allergy with detectable levels however no clinical history suggestive of true IgE mediated food allergy.  Plan as above.    Follow-up 31moprior to going abroad   I appreciate the opportunity to take part in [facility]. Please do not hesitate to contact me with questions.  Sincerely,   SPrudy Feeler MD

## 2015-09-23 DIAGNOSIS — T783XXA Angioneurotic edema, initial encounter: Secondary | ICD-10-CM | POA: Insufficient documentation

## 2015-09-23 DIAGNOSIS — L501 Idiopathic urticaria: Secondary | ICD-10-CM | POA: Insufficient documentation

## 2015-09-28 ENCOUNTER — Other Ambulatory Visit: Payer: Self-pay | Admitting: Obstetrics and Gynecology

## 2015-09-28 DIAGNOSIS — Z780 Asymptomatic menopausal state: Secondary | ICD-10-CM

## 2015-09-29 ENCOUNTER — Telehealth: Payer: Self-pay

## 2015-09-29 DIAGNOSIS — K219 Gastro-esophageal reflux disease without esophagitis: Secondary | ICD-10-CM

## 2015-09-29 NOTE — Telephone Encounter (Signed)
Pt seen on 09/22/15 By Dr. Delorse LekPadgett. She is having more problems with her acid reflux and would like to be ref to Dr. Dulce Sellarutlaw  Please Advise Thanks

## 2015-09-29 NOTE — Telephone Encounter (Signed)
Received message reflux symptoms are worsening despite Pepcid use.   If she has tried a PPI (prilosec, nexium) would recommend trial of this in addition to her Pepcid.   Will refer to Dr. Dulce Sellarutlaw for further eval/treatment options.

## 2015-09-29 NOTE — Telephone Encounter (Signed)
Dr Vandagriff please advise 

## 2015-09-30 NOTE — Telephone Encounter (Signed)
Ok thanks.  Placed referral for GI yesterday.

## 2015-09-30 NOTE — Telephone Encounter (Signed)
Spoke to patient and she stated that she is taking prevacid along with her pepcid and it is not working for her.

## 2015-10-05 ENCOUNTER — Ambulatory Visit
Admission: RE | Admit: 2015-10-05 | Discharge: 2015-10-05 | Disposition: A | Discharge status: Home / Self Care | Payer: BLUE CROSS/BLUE SHIELD | Source: Ambulatory Visit | Attending: Obstetrics and Gynecology | Admitting: Obstetrics and Gynecology

## 2015-10-05 DIAGNOSIS — Z780 Asymptomatic menopausal state: Secondary | ICD-10-CM

## 2015-10-14 ENCOUNTER — Other Ambulatory Visit: Payer: Self-pay | Admitting: Family Medicine

## 2015-10-14 ENCOUNTER — Other Ambulatory Visit (HOSPITAL_COMMUNITY)
Admission: RE | Admit: 2015-10-14 | Discharge: 2015-10-14 | Disposition: A | Discharge status: Home / Self Care | Payer: BLUE CROSS/BLUE SHIELD | Source: Ambulatory Visit | Attending: Family Medicine | Admitting: Family Medicine

## 2015-10-14 DIAGNOSIS — Z124 Encounter for screening for malignant neoplasm of cervix: Secondary | ICD-10-CM | POA: Diagnosis not present

## 2015-10-14 DIAGNOSIS — Z1231 Encounter for screening mammogram for malignant neoplasm of breast: Secondary | ICD-10-CM

## 2015-10-17 LAB — CYTOLOGY - PAP

## 2015-11-11 ENCOUNTER — Ambulatory Visit
Admission: RE | Admit: 2015-11-11 | Discharge: 2015-11-11 | Disposition: A | Discharge status: Home / Self Care | Payer: BLUE CROSS/BLUE SHIELD | Source: Ambulatory Visit | Attending: Family Medicine | Admitting: Family Medicine

## 2015-11-11 DIAGNOSIS — Z1231 Encounter for screening mammogram for malignant neoplasm of breast: Secondary | ICD-10-CM

## 2015-12-02 ENCOUNTER — Ambulatory Visit: Payer: BLUE CROSS/BLUE SHIELD | Admitting: Allergy

## 2015-12-14 ENCOUNTER — Other Ambulatory Visit: Payer: Self-pay | Admitting: Family Medicine

## 2015-12-14 DIAGNOSIS — Z79899 Other long term (current) drug therapy: Secondary | ICD-10-CM

## 2015-12-22 ENCOUNTER — Ambulatory Visit
Admission: RE | Admit: 2015-12-22 | Discharge: 2015-12-22 | Disposition: A | Discharge status: Home / Self Care | Payer: BLUE CROSS/BLUE SHIELD | Source: Ambulatory Visit | Attending: Family Medicine | Admitting: Family Medicine

## 2015-12-22 DIAGNOSIS — Z79899 Other long term (current) drug therapy: Secondary | ICD-10-CM

## 2016-10-08 ENCOUNTER — Other Ambulatory Visit: Payer: Self-pay | Admitting: Family Medicine

## 2016-10-08 DIAGNOSIS — Z1231 Encounter for screening mammogram for malignant neoplasm of breast: Secondary | ICD-10-CM

## 2016-11-12 ENCOUNTER — Ambulatory Visit
Admission: RE | Admit: 2016-11-12 | Discharge: 2016-11-12 | Disposition: A | Discharge status: Home / Self Care | Payer: Managed Care, Other (non HMO) | Source: Ambulatory Visit | Attending: Family Medicine | Admitting: Family Medicine

## 2016-11-12 DIAGNOSIS — Z1231 Encounter for screening mammogram for malignant neoplasm of breast: Secondary | ICD-10-CM

## 2016-11-13 ENCOUNTER — Other Ambulatory Visit: Payer: Self-pay | Admitting: Family Medicine

## 2016-11-13 DIAGNOSIS — R928 Other abnormal and inconclusive findings on diagnostic imaging of breast: Secondary | ICD-10-CM

## 2016-11-15 ENCOUNTER — Other Ambulatory Visit: Payer: Self-pay | Admitting: Family Medicine

## 2016-11-15 ENCOUNTER — Ambulatory Visit
Admission: RE | Admit: 2016-11-15 | Discharge: 2016-11-15 | Disposition: A | Discharge status: Home / Self Care | Payer: Managed Care, Other (non HMO) | Source: Ambulatory Visit | Attending: Family Medicine | Admitting: Family Medicine

## 2016-11-15 DIAGNOSIS — N6320 Unspecified lump in the left breast, unspecified quadrant: Secondary | ICD-10-CM

## 2016-11-15 DIAGNOSIS — R928 Other abnormal and inconclusive findings on diagnostic imaging of breast: Secondary | ICD-10-CM

## 2016-11-23 ENCOUNTER — Ambulatory Visit
Admission: RE | Admit: 2016-11-23 | Discharge: 2016-11-23 | Disposition: A | Discharge status: Home / Self Care | Payer: Managed Care, Other (non HMO) | Source: Ambulatory Visit | Attending: Family Medicine | Admitting: Family Medicine

## 2016-11-23 ENCOUNTER — Other Ambulatory Visit: Payer: Self-pay | Admitting: Family Medicine

## 2016-11-23 DIAGNOSIS — N6320 Unspecified lump in the left breast, unspecified quadrant: Secondary | ICD-10-CM

## 2017-09-30 ENCOUNTER — Other Ambulatory Visit: Payer: Self-pay | Admitting: Family Medicine

## 2017-09-30 DIAGNOSIS — Z1231 Encounter for screening mammogram for malignant neoplasm of breast: Secondary | ICD-10-CM

## 2017-10-07 ENCOUNTER — Other Ambulatory Visit: Payer: Self-pay | Admitting: Physician Assistant

## 2017-10-07 DIAGNOSIS — R102 Pelvic and perineal pain: Secondary | ICD-10-CM

## 2017-10-07 DIAGNOSIS — R1032 Left lower quadrant pain: Secondary | ICD-10-CM

## 2017-10-08 ENCOUNTER — Ambulatory Visit
Admission: RE | Admit: 2017-10-08 | Discharge: 2017-10-08 | Disposition: A | Discharge status: Home / Self Care | Payer: Managed Care, Other (non HMO) | Source: Ambulatory Visit | Attending: Physician Assistant | Admitting: Physician Assistant

## 2017-10-08 DIAGNOSIS — R1032 Left lower quadrant pain: Secondary | ICD-10-CM

## 2017-10-08 DIAGNOSIS — R102 Pelvic and perineal pain: Secondary | ICD-10-CM

## 2017-10-08 MED ORDER — IOPAMIDOL (ISOVUE-300) INJECTION 61%
100.0000 mL | Freq: Once | INTRAVENOUS | Status: AC | PRN
  Administered 2017-10-08: 100 mL via INTRAVENOUS

## 2017-10-23 ENCOUNTER — Ambulatory Visit (INDEPENDENT_AMBULATORY_CARE_PROVIDER_SITE_OTHER): Payer: Managed Care, Other (non HMO) | Admitting: Obstetrics and Gynecology

## 2017-10-23 ENCOUNTER — Other Ambulatory Visit: Payer: Self-pay

## 2017-10-23 ENCOUNTER — Encounter: Payer: Self-pay | Admitting: Obstetrics and Gynecology

## 2017-10-23 ENCOUNTER — Other Ambulatory Visit (HOSPITAL_COMMUNITY)
Admission: RE | Admit: 2017-10-23 | Discharge: 2017-10-23 | Disposition: A | Discharge status: Home / Self Care | Payer: Managed Care, Other (non HMO) | Source: Ambulatory Visit | Attending: Obstetrics and Gynecology | Admitting: Obstetrics and Gynecology

## 2017-10-23 VITALS — BP 112/56 | HR 68 | Resp 14 | Ht 62.0 in | Wt 135.0 lb

## 2017-10-23 DIAGNOSIS — Z1151 Encounter for screening for human papillomavirus (HPV): Secondary | ICD-10-CM | POA: Diagnosis not present

## 2017-10-23 DIAGNOSIS — Z124 Encounter for screening for malignant neoplasm of cervix: Secondary | ICD-10-CM | POA: Diagnosis not present

## 2017-10-23 DIAGNOSIS — N941 Unspecified dyspareunia: Secondary | ICD-10-CM | POA: Diagnosis not present

## 2017-10-23 DIAGNOSIS — M8580 Other specified disorders of bone density and structure, unspecified site: Secondary | ICD-10-CM

## 2017-10-23 DIAGNOSIS — N952 Postmenopausal atrophic vaginitis: Secondary | ICD-10-CM | POA: Diagnosis not present

## 2017-10-23 DIAGNOSIS — Z01419 Encounter for gynecological examination (general) (routine) without abnormal findings: Secondary | ICD-10-CM

## 2017-10-23 NOTE — Patient Instructions (Addendum)
EXERCISE AND DIET:  We recommended that you start or continue a regular exercise program for good health. Regular exercise means any activity that makes your heart beat faster and makes you sweat.  We recommend exercising at least 30 minutes per day at least 3 days a week, preferably 4 or 5.  We also recommend a diet low in fat and sugar.  Inactivity, poor dietary choices and obesity can cause diabetes, heart attack, stroke, and kidney damage, among others.    ALCOHOL AND SMOKING:  Women should limit their alcohol intake to no more than 7 drinks/beers/glasses of wine (combined, not each!) per week. Moderation of alcohol intake to this level decreases your risk of breast cancer and liver damage. And of course, no recreational drugs are part of a healthy lifestyle.  And absolutely no smoking or even second hand smoke. Most people know smoking can cause heart and lung diseases, but did you know it also contributes to weakening of your bones? Aging of your skin?  Yellowing of your teeth and nails?  CALCIUM AND VITAMIN D:  Adequate intake of calcium and Vitamin D are recommended.  The recommendations for exact amounts of these supplements seem to change often, but generally speaking 600 mg of calcium (either carbonate or citrate) and 800 units of Vitamin D per day seems prudent. Certain women may benefit from higher intake of Vitamin D.  If you are among these women, your doctor will have told you during your visit.    PAP SMEARS:  Pap smears, to check for cervical cancer or precancers,  have traditionally been done yearly, although recent scientific advances have shown that most women can have pap smears less often.  However, every woman still should have a physical exam from her gynecologist every year. It will include a breast check, inspection of the vulva and vagina to check for abnormal growths or skin changes, a visual exam of the cervix, and then an exam to evaluate the size and shape of the uterus and  ovaries.  And after 60 years of age, a rectal exam is indicated to check for rectal cancers. We will also provide age appropriate advice regarding health maintenance, like when you should have certain vaccines, screening for sexually transmitted diseases, bone density testing, colonoscopy, mammograms, etc.   MAMMOGRAMS:  All women over 40 years old should have a yearly mammogram. Many facilities now offer a "3D" mammogram, which may cost around $50 extra out of pocket. If possible,  we recommend you accept the option to have the 3D mammogram performed.  It both reduces the number of women who will be called back for extra views which then turn out to be normal, and it is better than the routine mammogram at detecting truly abnormal areas.    COLONOSCOPY:  Colonoscopy to screen for colon cancer is recommended for all women at age 50.  We know, you hate the idea of the prep.  We agree, BUT, having colon cancer and not knowing it is worse!!  Colon cancer so often starts as a polyp that can be seen and removed at colonscopy, which can quite literally save your life!  And if your first colonoscopy is normal and you have no family history of colon cancer, most women don't have to have it again for 10 years.  Once every ten years, you can do something that may end up saving your life, right?  We will be happy to help you get it scheduled when you are ready.    Be sure to check your insurance coverage so you understand how much it will cost.  It may be covered as a preventative service at no cost, but you should check your particular policy.      Breast Self-Awareness Breast self-awareness means being familiar with how your breasts look and feel. It involves checking your breasts regularly and reporting any changes to your health care provider. Practicing breast self-awareness is important. A change in your breasts can be a sign of a serious medical problem. Being familiar with how your breasts look and feel allows  you to find any problems early, when treatment is more likely to be successful. All women should practice breast self-awareness, including women who have had breast implants. How to do a breast self-exam One way to learn what is normal for your breasts and whether your breasts are changing is to do a breast self-exam. To do a breast self-exam: Look for Changes  1. Remove all the clothing above your waist. 2. Stand in front of a mirror in a room with good lighting. 3. Put your hands on your hips. 4. Push your hands firmly downward. 5. Compare your breasts in the mirror. Look for differences between them (asymmetry), such as: ? Differences in shape. ? Differences in size. ? Puckers, dips, and bumps in one breast and not the other. 6. Look at each breast for changes in your skin, such as: ? Redness. ? Scaly areas. 7. Look for changes in your nipples, such as: ? Discharge. ? Bleeding. ? Dimpling. ? Redness. ? A change in position. Feel for Changes  Carefully feel your breasts for lumps and changes. It is best to do this while lying on your back on the floor and again while sitting or standing in the shower or tub with soapy water on your skin. Feel each breast in the following way:  Place the arm on the side of the breast you are examining above your head.  Feel your breast with the other hand.  Start in the nipple area and make  inch (2 cm) overlapping circles to feel your breast. Use the pads of your three middle fingers to do this. Apply light pressure, then medium pressure, then firm pressure. The light pressure will allow you to feel the tissue closest to the skin. The medium pressure will allow you to feel the tissue that is a little deeper. The firm pressure will allow you to feel the tissue close to the ribs.  Continue the overlapping circles, moving downward over the breast until you feel your ribs below your breast.  Move one finger-width toward the center of the body.  Continue to use the  inch (2 cm) overlapping circles to feel your breast as you move slowly up toward your collarbone.  Continue the up and down exam using all three pressures until you reach your armpit.  Write Down What You Find  Write down what is normal for each breast and any changes that you find. Keep a written record with breast changes or normal findings for each breast. By writing this information down, you do not need to depend only on memory for size, tenderness, or location. Write down where you are in your menstrual cycle, if you are still menstruating. If you are having trouble noticing differences in your breasts, do not get discouraged. With time you will become more familiar with the variations in your breasts and more comfortable with the exam. How often should I examine my breasts? Examine   your breasts every month. If you are breastfeeding, the best time to examine your breasts is after a feeding or after using a breast pump. If you menstruate, the best time to examine your breasts is 5-7 days after your period is over. During your period, your breasts are lumpier, and it may be more difficult to notice changes. When should I see my health care provider? See your health care provider if you notice:  A change in shape or size of your breasts or nipples.  A change in the skin of your breast or nipples, such as a reddened or scaly area.  Unusual discharge from your nipples.  A lump or thick area that was not there before.  Pain in your breasts.  Anything that concerns you.  This information is not intended to replace advice given to you by your health care provider. Make sure you discuss any questions you have with your health care provider. Document Released: 02/05/2005 Document Revised: 07/14/2015 Document Reviewed: 12/26/2014 Elsevier Interactive Patient Education  2018 [employer]  

## 2017-10-23 NOTE — Progress Notes (Signed)
62 y.o. G3P3 MarriedCaucasianF here for annual exam.   2 x in the last month she was woken up with severe lower abdominal pain, from her umbilicus down. The pain lasted for about 20-30 minutes, 8/10 in severity, associated nausea. No diarrhea or constipation.  She had a CT in association of the pain. She hasn't been able to be sexually active for a year. She has dryness, but has severe, sharp pain, deep on the left every time she had sex. No sensation of spasm. The pain was going on for 7-8 years with sex.  No vaginal bleeding. She still has hot flashes, tolerable at the moment.     No LMP recorded. Patient is postmenopausal.          Sexually active: No.  The current method of family planning is post menopausal status.    Exercising: Yes.    step aerobics, yoga, weight training, kick boxing, walking Smoker:  no  Health Maintenance: Pap:  10-14-15 negative History of abnormal Pap:  Yes, 7-8 years ago, had a biopsy, no surgery on her cervix.  MMG: 11-23-16 breast bx- fibrocystic changes Colonoscopy:  2010 normal per patient BMD:   10-05-15 osteopenia, T score -1.3 in spine, -1.1 in hip  TDaP:  05-08-13  Gardasil: n/a   reports that she has never smoked. She has never used smokeless tobacco. She reports that she drinks about 3.0 - 4.0 standard drinks of alcohol per week. She reports that she does not use drugs. 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 3 grown children. Oldest child is a transgender female, lives locally. Middle child is a Son, lives in [place]. Youngest child is a daughter who just returned home from a mission trip. The youngest child struggles with her oldest child being transgender.   Past Medical History:  Diagnosis Date  . Thyroid disease     Past Surgical History:  Procedure Laterality Date  . OPEN REDUCTION INTERNAL FIXATION (ORIF) DISTAL RADIAL FRACTURE Right 08/07/2015   Procedure: OPEN REDUCTION INTERNAL FIXATION (ORIF) DISTAL RADIAL FRACTURE;   Surgeon: Dominica Severin, MD;  Location: [facility] OR;  Service: Orthopedics;  Laterality: Right;  . TUBAL LIGATION    . WRIST SURGERY      Current Outpatient Medications  Medication Sig Dispense Refill  . B Complex-C (SUPER B COMPLEX PO) Take by mouth.    . calcium-vitamin D (OSCAL WITH D) 500-200 MG-UNIT tablet Take 1 tablet by mouth.    . Cetirizine HCl (ZYRTEC PO) Take by mouth.    . levothyroxine (SYNTHROID, LEVOTHROID) 75 MCG tablet Take 75 mcg by mouth daily before breakfast.    . MAGNESIUM PO Take by mouth.    . Multiple Vitamins-Minerals (MULTIVITAMIN PO) Take by mouth.    . EPINEPHrine (EPIPEN 2-PAK) 0.3 mg/0.3 mL IJ SOAJ injection Inject 0.3 mLs (0.3 mg total) into the muscle once. 1 Device 2   No current facility-administered medications for this visit.     Family History  Problem Relation Age of Onset  . Uterine cancer Mother 84  . Diabetes Mellitus II Father   . Allergic rhinitis Son   . Asthma Son   . Allergic rhinitis Son   . Breast cancer Maternal Grandmother   . Angioedema Neg Hx   . Eczema Neg Hx   . Immunodeficiency Neg Hx   . Urticaria Neg Hx     Review of Systems  Constitutional: Negative.   HENT: Negative.   Eyes: Negative.  Respiratory: Negative.   Cardiovascular: Negative.   Gastrointestinal: Negative.   Endocrine: Negative.   Genitourinary: Positive for pelvic pain.  Musculoskeletal: Negative.   Allergic/Immunologic: Negative.   Neurological: Negative.   Hematological: Negative.   Psychiatric/Behavioral: Negative.     Exam:   BP (!) 112/56 (BP Location: Right Arm, Patient Position: Sitting, Cuff Size: Normal)   Pulse 68   Resp 14   Ht 5\' 2"  (1.575 m)   Wt 135 lb (61.2 kg)   BMI 24.69 kg/m   Weight change: @WEIGHTCHANGE @ Height:   Height: 5\' 2"  (157.5 cm)  Ht Readings from Last 3 Encounters:  10/23/17 5\' 2"  (1.575 m)  08/24/15 5\' 2"  (1.575 m)  08/21/15 5\' 2"  (1.575 m)    General appearance: alert, cooperative and appears stated  age Head: Normocephalic, without obvious abnormality, atraumatic Neck: no adenopathy, supple, symmetrical, trachea midline and thyroid normal to inspection and palpation Lungs: clear to auscultation bilaterally Cardiovascular: regular rate and rhythm Breasts: normal appearance, no masses or tenderness Abdomen: soft, non-tender; non distended,  no masses,  no organomegaly Extremities: extremities normal, atraumatic, no cyanosis or edema Skin: Skin color, texture, turgor normal. No rashes or lesions Lymph nodes: Cervical, supraclavicular, and axillary nodes normal. No abnormal inguinal nodes palpated Neurologic: Grossly normal   Pelvic: External genitalia:  no lesions              Urethra:  normal appearing urethra with no masses, tenderness or lesions              Bartholins and Skenes: normal                 Vagina: normal appearing vagina with normal color and discharge, no lesions              Cervix: no cervical motion tenderness and no lesions               Bimanual Exam:  Uterus:  normal size, contour, position, consistency, mobility, non-tender              Adnexa: no masses, mildly tender on the right.                Rectovaginal: Confirms               Anus:  normal sphincter tone, no lesions  Pelvic floor: not tender  Chaperone was present for exam.  A:  Well Woman with normal exam  Osteopenia, mild  Deep dyspareunia for years, stopped having sex because of this. 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 I don't think this is the cause of her severe episodes of abdominal pain. Information from UTD was given and reviewed, not described in menopausal women, shouldn't cause severe dyspareunia, but an ache after intercourse.      P:   Pap with hpv  Mammogram scheduled  DEXA in 3 more years  Colonoscopy next year  Discussed breast self exam  Discussed  calcium and vit D intake   She will try using lubricant and being sexually active again  She should control the rate and depth of penetration  We discussed the option of vaginal estrogen, she will call if she would like to start this (would call in the tablets)  If she has recurrent deep dyspareunia, it would be helpful to see her soon after the event to try and figure  out the area of tenderness  She will let me know if she would like a pelvic ultrasound to better evaluate her pelvic varices and pain

## 2017-10-23 NOTE — Addendum Note (Signed)
Addended by: Tobi Bastos on: 10/23/2017 01:12 PM   Modules accepted: Orders

## 2017-10-25 LAB — CYTOLOGY - PAP
DIAGNOSIS: NEGATIVE
HPV (WINDOPATH): NOT DETECTED

## 2017-11-18 ENCOUNTER — Ambulatory Visit
Admission: RE | Admit: 2017-11-18 | Discharge: 2017-11-18 | Disposition: A | Discharge status: Home / Self Care | Payer: Managed Care, Other (non HMO) | Source: Ambulatory Visit | Attending: Family Medicine | Admitting: Family Medicine

## 2017-11-18 DIAGNOSIS — Z1231 Encounter for screening mammogram for malignant neoplasm of breast: Secondary | ICD-10-CM

## 2018-01-28 ENCOUNTER — Encounter: Payer: Managed Care, Other (non HMO) | Admitting: Internal Medicine

## 2018-10-13 ENCOUNTER — Other Ambulatory Visit: Payer: Self-pay | Admitting: Obstetrics and Gynecology

## 2018-10-13 DIAGNOSIS — Z1231 Encounter for screening mammogram for malignant neoplasm of breast: Secondary | ICD-10-CM

## 2018-10-24 ENCOUNTER — Other Ambulatory Visit: Payer: Self-pay

## 2018-10-28 NOTE — Progress Notes (Signed)
63 y.o. G3P3 Married White or Caucasian Not Hispanic or Latino female here for annual exam. Long h/o deep dyspareunia, stopped being sexually active because of it. Prior normal exam.    No vaginal bleeding. Some constipation off and on. No urinary c/o.   Husband being evaluated for possible early onset dementia.     No LMP recorded. Patient is postmenopausal.          Sexually active: No.  The current method of family planning is tubal ligation.    Exercising: Yes.    walking, yoga, strength training Smoker:  no  Health Maintenance: Pap: 10/23/2017 WNL NEG HPV History of abnormal Pap:  Yes, had a biopsy, no surgery on her cervix.  MMG: 11/01/2017 Birads 1 negative, scheduled.  Colonoscopy:  2010 normal per patient BMD:   10-05-15 osteopenia, T score -1.3 in spine, -1.1 in hip  TDaP:  05-08-13  Gardasil: n/a   reports that she has never smoked. She has never used smokeless tobacco. She reports current alcohol use of about 5.0 standard drinks of alcohol per week. She reports that she does not use drugs. 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 3 grown children. Oldest child is a transgender female, lives locally. Middle child is a Son, lives in [place]. Youngest child is a daughter. She is pregnant, due in December (currently living with the patient)   Past Medical History:  Diagnosis Date  . Thyroid disease     Past Surgical History:  Procedure Laterality Date  . BREAST BIOPSY Left   . BREAST EXCISIONAL BIOPSY    . OPEN REDUCTION INTERNAL FIXATION (ORIF) DISTAL RADIAL FRACTURE Right 08/07/2015   Procedure: OPEN REDUCTION INTERNAL FIXATION (ORIF) DISTAL RADIAL FRACTURE;  Surgeon: Dominica SeverinWilliam Gramig, MD;  Location: [facility] OR;  Service: Orthopedics;  Laterality: Right;  . TUBAL LIGATION    . WRIST SURGERY      Current Outpatient Medications  Medication Sig Dispense Refill  . B Complex-C (SUPER B COMPLEX PO) Take by mouth.    . calcium-vitamin D (OSCAL WITH D) 500-200 MG-UNIT  tablet Take 1 tablet by mouth.    . Cetirizine HCl (ZYRTEC PO) Take by mouth.    . levothyroxine (SYNTHROID, LEVOTHROID) 75 MCG tablet Take 75 mcg by mouth daily before breakfast.    . MAGNESIUM PO Take by mouth.    . Multiple Vitamins-Minerals (MULTIVITAMIN PO) Take by mouth.     No current facility-administered medications for this visit.     Family History  Problem Relation Age of Onset  . Uterine cancer Mother [id number]  . Diabetes Mellitus II Father   . Allergic rhinitis Son   . Asthma Son   . Allergic rhinitis Son   . Breast cancer Maternal Grandmother   . Angioedema Neg Hx   . Eczema Neg Hx   . Immunodeficiency Neg Hx   . Urticaria Neg Hx   Daughter diagnosed with MTHFR (homozygous)   Review of Systems  Constitutional: Negative.   HENT: Negative.   Eyes: Negative.   Respiratory: Negative.   Cardiovascular: Negative.   Gastrointestinal: Negative.   Endocrine: Negative.   Genitourinary: Negative.   Musculoskeletal: Negative.   Skin: Negative.   Allergic/Immunologic: Negative.   Neurological: Negative.   Hematological: Negative.   Psychiatric/Behavioral: Negative.     Exam:   BP 112/68 (BP Location: Right Arm, Patient Position: Sitting, Cuff Size: Normal)   Pulse 64   Temp (!) 97.1 F (36.2 C) (Skin)   Ht  4' 11.84" (1.52 m)   Wt 139 lb (63 kg)   BMI 27.29 kg/m   Weight change: @WEIGHTCHANGE @ Height:   Height: 4' 11.84" (152 cm)  Ht Readings from Last 3 Encounters:  10/29/18 4' 11.84" (1.52 m)  10/23/17 5\' 2"  (1.575 m)  08/24/15 5\' 2"  (1.575 m)    General appearance: alert, cooperative and appears stated age Head: Normocephalic, without obvious abnormality, atraumatic Neck: no adenopathy, supple, symmetrical, trachea midline and thyroid normal to inspection and palpation Lungs: clear to auscultation bilaterally Cardiovascular: regular rate and rhythm Breasts: normal appearance, no masses or tenderness Abdomen: soft, non-tender; non distended,  no masses,  no  organomegaly Extremities: extremities normal, atraumatic, no cyanosis or edema Skin: Skin color, texture, turgor normal. No rashes or lesions Lymph nodes: Cervical, supraclavicular, and axillary nodes normal. No abnormal inguinal nodes palpated Neurologic: Grossly normal   Pelvic: External genitalia:  no lesions              Urethra:  normal appearing urethra with no masses, tenderness or lesions              Bartholins and Skenes: normal                 Vagina: normal appearing vagina with normal color and discharge, no lesions              Cervix: no lesions               Bimanual Exam:  Uterus:  normal size, contour, position, consistency, mobility, non-tender              Adnexa: no mass, fullness, tenderness               Rectovaginal: Confirms               Anus:  normal sphincter tone, no lesions  Pelvic floor: not tender  Bladder: not tender  Chaperone was present for exam.  A:  Well Woman with normal exam  Very mild osteopenia  Hypothyroidism  Deep dyspareunia, hasn't been sexually active in the last year. Normal GYN exam, including palpation of pelvic floor.   P:   DEXA in 2022  No pap this year  Mammogram due  Colonoscopy due  Discussed breast self exam  Discussed calcium and vit D intake  Screening labs, TSH (typically gets with her primary, not being seen this year)  If sexually active she should control rate and depth of penetration. Discussed possible PT evaluation if the pain persists.

## 2018-10-29 ENCOUNTER — Encounter: Payer: Self-pay | Admitting: Obstetrics and Gynecology

## 2018-10-29 ENCOUNTER — Ambulatory Visit (INDEPENDENT_AMBULATORY_CARE_PROVIDER_SITE_OTHER): Payer: Managed Care, Other (non HMO) | Admitting: Obstetrics and Gynecology

## 2018-10-29 ENCOUNTER — Other Ambulatory Visit: Payer: Self-pay

## 2018-10-29 VITALS — BP 112/68 | HR 64 | Temp 97.1°F | Ht 61.75 in | Wt 139.0 lb

## 2018-10-29 DIAGNOSIS — N941 Unspecified dyspareunia: Secondary | ICD-10-CM

## 2018-10-29 DIAGNOSIS — E039 Hypothyroidism, unspecified: Secondary | ICD-10-CM | POA: Diagnosis not present

## 2018-10-29 DIAGNOSIS — Z01419 Encounter for gynecological examination (general) (routine) without abnormal findings: Secondary | ICD-10-CM | POA: Diagnosis not present

## 2018-10-29 DIAGNOSIS — M8580 Other specified disorders of bone density and structure, unspecified site: Secondary | ICD-10-CM

## 2018-10-29 DIAGNOSIS — N952 Postmenopausal atrophic vaginitis: Secondary | ICD-10-CM | POA: Diagnosis not present

## 2018-10-29 DIAGNOSIS — Z0000 Encounter for general adult medical examination without abnormal findings: Secondary | ICD-10-CM

## 2018-10-29 DIAGNOSIS — E559 Vitamin D deficiency, unspecified: Secondary | ICD-10-CM

## 2018-10-29 NOTE — Patient Instructions (Signed)

## 2018-10-30 ENCOUNTER — Telehealth: Payer: Self-pay | Admitting: Obstetrics and Gynecology

## 2018-10-30 ENCOUNTER — Encounter: Payer: Self-pay | Admitting: Obstetrics and Gynecology

## 2018-10-30 LAB — COMPREHENSIVE METABOLIC PANEL
ALT: 14 IU/L (ref 0–32)
AST: 19 IU/L (ref 0–40)
Albumin/Globulin Ratio: 2 (ref 1.2–2.2)
Albumin: 4.5 g/dL (ref 3.8–4.9)
Alkaline Phosphatase: 74 IU/L (ref 39–117)
BUN/Creatinine Ratio: 20 (ref 12–28)
BUN: 18 mg/dL (ref 8–27)
Bilirubin Total: 0.3 mg/dL (ref 0.0–1.2)
CO2: 26 mmol/L (ref 20–29)
Calcium: 9.5 mg/dL (ref 8.7–10.3)
Chloride: 100 mmol/L (ref 96–106)
Creatinine, Ser: 0.9 mg/dL (ref 0.57–1.00)
GFR calc Af Amer: 80 mL/min/{1.73_m2} (ref >59)
GFR calc non Af Amer: 70 mL/min/{1.73_m2} (ref >59)
Globulin, Total: 2.2 g/dL (ref 1.5–4.5)
Glucose: 83 mg/dL (ref 65–99)
Potassium: 4.3 mmol/L (ref 3.5–5.2)
Sodium: 140 mmol/L (ref 134–144)
Total Protein: 6.7 g/dL (ref 6.0–8.5)

## 2018-10-30 LAB — CBC
Hematocrit: 41.7 % (ref 34.0–46.6)
Hemoglobin: 13.4 g/dL (ref 11.1–15.9)
MCH: 31.3 pg (ref 26.6–33.0)
MCHC: 32.1 g/dL (ref 31.5–35.7)
MCV: 97 fL (ref 79–97)
Platelets: 257 10*3/uL (ref 150–450)
RBC: 4.28 x10E6/uL (ref 3.77–5.28)
RDW: 12.4 % (ref 11.7–15.4)
WBC: 6.3 10*3/uL (ref 3.4–10.8)

## 2018-10-30 LAB — TSH: TSH: 2.73 u[IU]/mL (ref 0.450–4.500)

## 2018-10-30 LAB — VITAMIN D 25 HYDROXY (VIT D DEFICIENCY, FRACTURES): Vit D, 25-Hydroxy: 38.6 ng/mL (ref 30.0–100.0)

## 2018-10-30 LAB — LIPID PANEL
Chol/HDL Ratio: 3.2 ratio (ref 0.0–4.4)
Cholesterol, Total: 227 mg/dL — ABNORMAL HIGH (ref 100–199)
HDL: 72 mg/dL (ref >39)
LDL Chol Calc (NIH): 137 mg/dL — ABNORMAL HIGH (ref 0–99)
Triglycerides: 104 mg/dL (ref 0–149)
VLDL Cholesterol Cal: 18 mg/dL (ref 5–40)

## 2018-10-30 NOTE — Telephone Encounter (Signed)
Copy of 10/29/18 labs forwarded to PCP/Dr. Kenton Kingfisher via Epic.   Routing MyChart message to Dr. Talbert Nan to review.

## 2018-10-30 NOTE — Telephone Encounter (Signed)
Patient sent the following correspondence through [facility].  Hi, My labs show TSH is 2.73, which for me is high.  I usually keep it around 1 which is where I feel good and where it has been for the last several years.  This is true of most hypothyroid people who are on synthroid.  The normal range doesn't really apply to [place].  TSH was 0.99 last August. I will ask my PCP, Dr. Kenton Kingfisher for an increase in my medication.  If you can just make sure the records are sent to him quickly, I would really appreciate it. Thanks!

## 2018-11-25 ENCOUNTER — Ambulatory Visit
Admission: RE | Admit: 2018-11-25 | Discharge: 2018-11-25 | Disposition: A | Discharge status: Home / Self Care | Payer: Managed Care, Other (non HMO) | Source: Ambulatory Visit | Attending: Obstetrics and Gynecology | Admitting: Obstetrics and Gynecology

## 2018-11-25 ENCOUNTER — Other Ambulatory Visit: Payer: Self-pay

## 2018-11-25 DIAGNOSIS — Z1231 Encounter for screening mammogram for malignant neoplasm of breast: Secondary | ICD-10-CM

## 2019-04-03 ENCOUNTER — Other Ambulatory Visit: Payer: Self-pay | Admitting: Family Medicine

## 2019-04-03 DIAGNOSIS — M8588 Other specified disorders of bone density and structure, other site: Secondary | ICD-10-CM

## 2019-06-16 ENCOUNTER — Other Ambulatory Visit: Payer: Managed Care, Other (non HMO)

## 2019-07-02 ENCOUNTER — Other Ambulatory Visit: Payer: Self-pay

## 2019-07-02 ENCOUNTER — Ambulatory Visit
Admission: RE | Admit: 2019-07-02 | Discharge: 2019-07-02 | Disposition: A | Discharge status: Home / Self Care | Payer: Managed Care, Other (non HMO) | Source: Ambulatory Visit | Attending: Family Medicine | Admitting: Family Medicine

## 2019-07-02 DIAGNOSIS — M8588 Other specified disorders of bone density and structure, other site: Secondary | ICD-10-CM

## 2019-10-12 ENCOUNTER — Other Ambulatory Visit: Payer: Self-pay | Admitting: Obstetrics and Gynecology

## 2019-10-12 DIAGNOSIS — Z1231 Encounter for screening mammogram for malignant neoplasm of breast: Secondary | ICD-10-CM

## 2019-11-05 ENCOUNTER — Ambulatory Visit: Payer: Managed Care, Other (non HMO) | Admitting: Obstetrics and Gynecology

## 2019-11-26 ENCOUNTER — Other Ambulatory Visit: Payer: Self-pay

## 2019-11-26 ENCOUNTER — Ambulatory Visit
Admission: RE | Admit: 2019-11-26 | Discharge: 2019-11-26 | Disposition: A | Discharge status: Home / Self Care | Payer: Managed Care, Other (non HMO) | Source: Ambulatory Visit | Attending: Obstetrics and Gynecology | Admitting: Obstetrics and Gynecology

## 2019-11-26 DIAGNOSIS — Z1231 Encounter for screening mammogram for malignant neoplasm of breast: Secondary | ICD-10-CM

## 2020-07-28 ENCOUNTER — Ambulatory Visit: Payer: Managed Care, Other (non HMO) | Admitting: Obstetrics and Gynecology

## 2020-08-26 ENCOUNTER — Ambulatory Visit: Payer: Managed Care, Other (non HMO) | Admitting: Obstetrics and Gynecology

## 2020-12-26 NOTE — Progress Notes (Signed)
65 y.o. G3P3 Married White or Caucasian Not Hispanic or Latino female here for annual exam. No vaginally bleeding. She hasn't been sexually active for years secondary to deep dyspareunia.     Husband is 21, has early onset Alzheimer's. Married x 39 years.   They are leaving to go Sailing for 6 months. Boat is currently in [place] bringing the boat back to [place].   No LMP recorded. Patient is postmenopausal.          Sexually active: No. Long h/o deep dyspareunia.  The current method of family planning is post menopausal status/tubal ligation.  Exercising: Yes.     HIIT Walking Yoga  Smoker:  no  Health Maintenance: Pap:  10/23/2017- WNL, HPV- neg; 10/14/2015- WNL History of abnormal Pap:  yes, colpo-no surgery MMG:  11/26/19- birads 1, she will schedule.   BMD:   07/02/19- osteopenia -2.1, she will get a f/u DEXA with her primary.  Colonoscopy: ~2020 or 2021, normal.  TDaP:  05/08/2013 Gardasil: n/a    reports that she has never smoked. She has never used smokeless tobacco. She reports current alcohol use of about 5.0 standard drinks per week. She reports that she does not use drugs. ~ 7 drinks a week. 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 3 grown children. Oldest child is a transgender female, lives locally. Middle child is a Son, lives in [place]. Youngest child is a daughter, lives in [place]. Daughter has a 66 year old son and is pregnant with another boy.   Past Medical History:  Diagnosis Date   Thyroid disease     Past Surgical History:  Procedure Laterality Date   BREAST BIOPSY Left    BREAST EXCISIONAL BIOPSY     OPEN REDUCTION INTERNAL FIXATION (ORIF) DISTAL RADIAL FRACTURE Right 08/07/2015   Procedure: OPEN REDUCTION INTERNAL FIXATION (ORIF) DISTAL RADIAL FRACTURE;  Surgeon: Dominica Severin, MD;  Location: [facility];  Service: Orthopedics;  Laterality: Right;   TUBAL LIGATION     WRIST SURGERY      Current Outpatient Medications  Medication Sig Dispense  Refill   B Complex-C (SUPER B COMPLEX PO) Take by mouth.     calcium-vitamin D (OSCAL WITH D) 500-200 MG-UNIT tablet Take 1 tablet by mouth.     MAGNESIUM PO Take by mouth.     Multiple Vitamins-Minerals (MULTIVITAMIN PO) Take by mouth.     SYNTHROID 100 MCG tablet Take 100 mcg by mouth daily.     No current facility-administered medications for this visit.    Family History  Problem Relation Age of Onset   Uterine cancer Mother 33   Diabetes Mellitus II Father    Allergic rhinitis Son    Asthma Son    Allergic rhinitis Son    Breast cancer Maternal Grandmother    Angioedema Neg Hx    Eczema Neg Hx    Immunodeficiency Neg Hx    Urticaria Neg Hx     Review of Systems  All other systems reviewed and are negative.  Exam:   BP 120/62   Pulse 63   Ht 5' 1.75" (1.568 m)   Wt 140 lb (63.5 kg)   SpO2 100%   BMI 25.81 kg/m   Weight change: @WEIGHTCHANGE @ Height:   Height: 5' 1.75" (156.8 cm)  Ht Readings from Last 3 Encounters:  12/29/20 5' 1.75" (1.568 m)  10/29/18 5' 1.75" (1.568 m)  10/23/17 5\' 2"  (1.575 m)    General appearance: alert,  cooperative and appears stated age Head: Normocephalic, without obvious abnormality, atraumatic Neck: no adenopathy, supple, symmetrical, trachea midline and thyroid normal to inspection and palpation Lungs: clear to auscultation bilaterally Cardiovascular: regular rate and rhythm Breasts: normal appearance, no masses or tenderness, left nipple inverted (always) Abdomen: soft, non-tender; non distended,  no masses,  no organomegaly Extremities: extremities normal, atraumatic, no cyanosis or edema Skin: Skin color, texture, turgor normal. No rashes or lesions Lymph nodes: Cervical, supraclavicular, and axillary nodes normal. No abnormal inguinal nodes palpated Neurologic: Grossly normal   Pelvic: External genitalia:  no lesions              Urethra:  normal appearing urethra with no masses, tenderness or lesions               Bartholins and Skenes: normal                 Vagina: mildly atrophic appearing vagina with normal color and discharge, no lesions              Cervix: no cervical motion tenderness and no lesions               Bimanual Exam:  Uterus:  normal size, contour, position, consistency, mobility, non-tender              Adnexa: no mass, fullness, tenderness               Rectovaginal: Confirms               Anus:  normal sphincter tone, no lesions  Bladder: not tender  Pelvic floor: not tender  Carolynn Serve chaperoned for the exam.  1. Well woman exam Discussed breast self exam Labs with primary Colonoscopy is UTD She will schedule her mammogram  2. History of osteopenia Discussed calcium and vit D intake DEXA with primary next year  3. Deep dyspareunia Normal exam Discussed her controlling rate and depth of penetration, adequate lubrication She will try to pay very close attention so she can describe the pain if it occurs again.

## 2020-12-29 ENCOUNTER — Ambulatory Visit (INDEPENDENT_AMBULATORY_CARE_PROVIDER_SITE_OTHER): Payer: Managed Care, Other (non HMO) | Admitting: Obstetrics and Gynecology

## 2020-12-29 ENCOUNTER — Encounter: Payer: Self-pay | Admitting: Obstetrics and Gynecology

## 2020-12-29 ENCOUNTER — Other Ambulatory Visit: Payer: Self-pay

## 2020-12-29 VITALS — BP 120/62 | HR 63 | Ht 61.75 in | Wt 140.0 lb

## 2020-12-29 DIAGNOSIS — R87619 Unspecified abnormal cytological findings in specimens from cervix uteri: Secondary | ICD-10-CM | POA: Insufficient documentation

## 2020-12-29 DIAGNOSIS — N9412 Deep dyspareunia: Secondary | ICD-10-CM

## 2020-12-29 DIAGNOSIS — Z8739 Personal history of other diseases of the musculoskeletal system and connective tissue: Secondary | ICD-10-CM

## 2020-12-29 DIAGNOSIS — Z01419 Encounter for gynecological examination (general) (routine) without abnormal findings: Secondary | ICD-10-CM | POA: Diagnosis not present

## 2020-12-29 DIAGNOSIS — N941 Unspecified dyspareunia: Secondary | ICD-10-CM | POA: Insufficient documentation

## 2020-12-29 NOTE — Patient Instructions (Signed)

## 2021-04-28 ENCOUNTER — Other Ambulatory Visit: Payer: Self-pay | Admitting: Family Medicine

## 2021-04-28 DIAGNOSIS — Z1231 Encounter for screening mammogram for malignant neoplasm of breast: Secondary | ICD-10-CM

## 2021-05-17 ENCOUNTER — Ambulatory Visit: Payer: Managed Care, Other (non HMO)

## 2021-05-17 ENCOUNTER — Ambulatory Visit
Admission: RE | Admit: 2021-05-17 | Discharge: 2021-05-17 | Disposition: A | Discharge status: Home / Self Care | Payer: Managed Care, Other (non HMO) | Source: Ambulatory Visit | Attending: Family Medicine | Admitting: Family Medicine

## 2021-05-17 DIAGNOSIS — Z1231 Encounter for screening mammogram for malignant neoplasm of breast: Secondary | ICD-10-CM

## 2021-08-15 ENCOUNTER — Other Ambulatory Visit: Payer: Self-pay | Admitting: Family Medicine

## 2021-08-15 DIAGNOSIS — M8588 Other specified disorders of bone density and structure, other site: Secondary | ICD-10-CM

## 2021-12-27 NOTE — Progress Notes (Deleted)
66 y.o. G3P3 Married White or Caucasian Not Hispanic or Latino female here for annual exam.      No LMP recorded. Patient is postmenopausal.          Sexually active: {yes no:314532}  The current method of family planning is {contraception:315051}.    Exercising: {yes no:314532}  {types:19826} Smoker:  {YES [id number]  Health Maintenance: Pap: 10/23/2017- WNL, HPV- neg; 10/14/2015- WNL History of abnormal Pap:  yes, colpo-no surgery MMG:  05/22/21 Bi-rads 1 neg  BMD:   07/02/19 osteopenic  Colonoscopy: 2020 or 2021, normal.  TDaP:  05/08/2013 Gardasil: n/a    reports that she has never smoked. She has never used smokeless tobacco. She reports current alcohol use of about 5.0 standard drinks of alcohol per week. She reports that she does not use drugs.  Past Medical History:  Diagnosis Date   Thyroid disease     Past Surgical History:  Procedure Laterality Date   BREAST BIOPSY Left    BREAST EXCISIONAL BIOPSY     OPEN REDUCTION INTERNAL FIXATION (ORIF) DISTAL RADIAL FRACTURE Right 08/07/2015   Procedure: OPEN REDUCTION INTERNAL FIXATION (ORIF) DISTAL RADIAL FRACTURE;  Surgeon: Dominica Severin, MD;  Location: [facility];  Service: Orthopedics;  Laterality: Right;   TUBAL LIGATION     WRIST SURGERY      Current Outpatient Medications  Medication Sig Dispense Refill   B Complex-C (SUPER B COMPLEX PO) Take by mouth.     calcium-vitamin D (OSCAL WITH D) 500-200 MG-UNIT tablet Take 1 tablet by mouth.     MAGNESIUM PO Take by mouth.     Multiple Vitamins-Minerals (MULTIVITAMIN PO) Take by mouth.     SYNTHROID 100 MCG tablet Take 100 mcg by mouth daily.     No current facility-administered medications for this visit.    Family History  Problem Relation Age of Onset   Uterine cancer Mother 1   Diabetes Mellitus II Father    Allergic rhinitis Son    Asthma Son    Allergic rhinitis Son    Breast cancer Maternal Grandmother    Angioedema Neg Hx    Eczema Neg Hx    Immunodeficiency Neg Hx     Urticaria Neg Hx     Review of Systems  Exam:   There were no vitals taken for this visit.  Weight change: @WEIGHTCHANGE @ Height:      Ht Readings from Last 3 Encounters:  12/29/20 5' 1.75" (1.568 m)  10/29/18 5' 1.75" (1.568 m)  10/23/17 5\' 2"  (1.575 m)    General appearance: alert, cooperative and appears stated age Head: Normocephalic, without obvious abnormality, atraumatic Neck: no adenopathy, supple, symmetrical, trachea midline and thyroid {CHL AMB PHY EX THYROID NORM DEFAULT:[phone]::"normal to inspection and palpation"} Lungs: clear to auscultation bilaterally Cardiovascular: regular rate and rhythm Breasts: {Exam; breast:13139::"normal appearance, no masses or tenderness"} Abdomen: soft, non-tender; non distended,  no masses,  no organomegaly Extremities: extremities normal, atraumatic, no cyanosis or edema Skin: Skin color, texture, turgor normal. No rashes or lesions Lymph nodes: Cervical, supraclavicular, and axillary nodes normal. No abnormal inguinal nodes palpated Neurologic: Grossly normal   Pelvic: External genitalia:  no lesions              Urethra:  normal appearing urethra with no masses, tenderness or lesions              Bartholins and Skenes: normal                 Vagina:  normal appearing vagina with normal color and discharge, no lesions              Cervix: {CHL AMB PHY EX CERVIX NORM [phone]::"no lesions"}               Bimanual Exam:  Uterus:  {CHL AMB PHY EX UTERUS NORM [phone]::"normal size, contour, position, consistency, mobility, non-tender"}              Adnexa: {CHL AMB PHY EX ADNEXA NO MASS DEFAULT:[phone]::"no mass, fullness, tenderness"}               Rectovaginal: Confirms               Anus:  normal sphincter tone, no lesions  *** chaperoned for the exam.  A:  Well Woman with normal exam  P:

## 2022-01-04 ENCOUNTER — Ambulatory Visit: Payer: Managed Care, Other (non HMO) | Admitting: Obstetrics and Gynecology

## 2022-02-21 ENCOUNTER — Ambulatory Visit
Admission: RE | Admit: 2022-02-21 | Discharge: 2022-02-21 | Disposition: A | Discharge status: Home / Self Care | Payer: Managed Care, Other (non HMO) | Source: Ambulatory Visit | Attending: Family Medicine | Admitting: Family Medicine

## 2022-02-21 DIAGNOSIS — M8588 Other specified disorders of bone density and structure, other site: Secondary | ICD-10-CM

## 2022-04-03 ENCOUNTER — Other Ambulatory Visit: Payer: Self-pay | Admitting: Family Medicine

## 2022-04-03 DIAGNOSIS — Z1231 Encounter for screening mammogram for malignant neoplasm of breast: Secondary | ICD-10-CM

## 2022-05-23 ENCOUNTER — Ambulatory Visit
Admission: RE | Admit: 2022-05-23 | Discharge: 2022-05-23 | Disposition: A | Discharge status: Home / Self Care | Payer: Managed Care, Other (non HMO) | Source: Ambulatory Visit | Attending: Family Medicine | Admitting: Family Medicine

## 2022-05-23 DIAGNOSIS — Z1231 Encounter for screening mammogram for malignant neoplasm of breast: Secondary | ICD-10-CM

## 2022-05-24 ENCOUNTER — Ambulatory Visit
Admission: EM | Admit: 2022-05-24 | Discharge: 2022-05-24 | Disposition: A | Discharge status: Home / Self Care | Payer: Managed Care, Other (non HMO)

## 2022-05-24 ENCOUNTER — Ambulatory Visit: Payer: Managed Care, Other (non HMO)

## 2022-05-24 DIAGNOSIS — M19041 Primary osteoarthritis, right hand: Secondary | ICD-10-CM

## 2022-05-24 DIAGNOSIS — M79641 Pain in right hand: Secondary | ICD-10-CM

## 2022-05-24 HISTORY — DX: Other specified disorders of bone density and structure, unspecified site: M85.80

## 2022-05-24 MED ORDER — PREDNISONE 20 MG PO TABS: ORAL_TABLET | ORAL | 0 refills | Status: AC

## 2022-05-24 NOTE — ED Provider Notes (Signed)
[facility]  Note:  This document was prepared using Systems analyst and may include unintentional dictation errors.  MRN: [id number] DOB: 09/07/1955  Subjective:   [facility] Margaret Gentry is a 67 y.o. female presenting for 3-day history of acute onset persistent moderate right thumb pain over the palm/thenar eminence, MCP and DIP.  No trauma, swelling, warmth, erythema, wounds.  Has had remote history of a distal radial fracture that was repaired surgically.  She does yard work but nothing excessive.  Has a history of osteopenia.  No current facility-administered medications for this encounter.  Current Outpatient Medications:    fexofenadine (ALLEGRA) 60 MG tablet, Take 60 mg by mouth 2 (two) times daily., Disp: , Rfl:    B Complex-C (SUPER B COMPLEX PO), Take by mouth., Disp: , Rfl:    Biotin 1 MG CAPS, Biotin, Disp: , Rfl:    calcium-vitamin D (OSCAL WITH D) 500-200 MG-UNIT tablet, Take 1 tablet by mouth., Disp: , Rfl:    MAGNESIUM PO, Take by mouth., Disp: , Rfl:    Multiple Vitamins-Minerals (MULTIVITAMIN PO), Take by mouth., Disp: , Rfl:    SYNTHROID 100 MCG tablet, Take 100 mcg by mouth daily., Disp: , Rfl:    No Known Allergies  Past Medical History:  Diagnosis Date   Osteopenia    per pt   Thyroid disease      Past Surgical History:  Procedure Laterality Date   BREAST BIOPSY Left    BREAST EXCISIONAL BIOPSY     OPEN REDUCTION INTERNAL FIXATION (ORIF) DISTAL RADIAL FRACTURE Right 08/07/2015   Procedure: OPEN REDUCTION INTERNAL FIXATION (ORIF) DISTAL RADIAL FRACTURE;  Surgeon: Roseanne Kaufman, MD;  Location: [facility];  Service: Orthopedics;  Laterality: Right;   TUBAL LIGATION     WRIST SURGERY      Family History  Problem Relation Age of Onset   Uterine cancer Mother 23   Diabetes Mellitus II Father    Allergic rhinitis Son    Asthma Son    Allergic rhinitis Son    Breast cancer Maternal Grandmother    Angioedema Neg Hx     Eczema Neg Hx    Immunodeficiency Neg Hx    Urticaria Neg Hx     Social History   Tobacco Use   Smoking status: Never   Smokeless tobacco: Never  Vaping Use   Vaping Use: Never used  Substance Use Topics   Alcohol use: Not Currently    Comment: daily   Drug use: No    ROS   Objective:   Vitals: BP 110/74 (BP Location: Right Arm)   Pulse 64   Temp 98.7 F (37.1 C) (Oral)   Resp 18   SpO2 97%   Physical Exam Constitutional:      General: She is not in acute distress.    Appearance: Normal appearance. She is well-developed. She is not ill-appearing, toxic-appearing or diaphoretic.  HENT:     Head: Normocephalic and atraumatic.     Nose: Nose normal.     Mouth/Throat:     Mouth: Mucous membranes are moist.  Eyes:     General: No scleral icterus.       Right eye: No discharge.        Left eye: No discharge.     Extraocular Movements: Extraocular movements intact.  Cardiovascular:     Rate and Rhythm: Normal rate.  Pulmonary:     Effort: Pulmonary effort is normal.  Musculoskeletal:  Hands:  Skin:    General: Skin is warm and dry.  Neurological:     General: No focal deficit present.     Mental Status: She is alert and oriented to person, place, and time.  Psychiatric:        Mood and Affect: Mood normal.        Behavior: Behavior normal.     DG Hand Complete Right  Result Date: 05/24/2022 CLINICAL DATA:  Right hand pain.  Thumb pain.  No known injury. EXAM: RIGHT HAND - COMPLETE 3+ VIEW COMPARISON:  Right wrist radiographs 08/07/2015 (multiple studies) FINDINGS: Interval volar plate and screw fixation of the distal radius, partially visualized. Markedly improved alignment of the prior acute, comminuted distal radial intra-articular fracture. Moderate second and third and mild-to-moderate fourth and fifth DIP joint space narrowing with second and third DIP dorsal degenerative spurs and a 3 mm well corticated ossicle dorsal medial to the index finger  DIP joint. Mild thumb interphalangeal joint space narrowing. Mild-to-moderate thumb carpometacarpal joint space narrowing and peripheral osteophytosis. Mild radiocarpal joint space narrowing. Tiny chronic ossicle lateral to the thumb carpometacarpal joint. Well corticated, nonunited, remote base of the ulnar styloid fracture. IMPRESSION: 1. 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 2. Mild radiocarpal osteoarthritis. 3. Status post remote ORIF of the distal radius, new from the comparison 2017 radiographs. Electronically Signed   By: Yvonne Kendall M.D.   On: 05/24/2022 11:19     Assessment and Plan :   PDMP not reviewed this encounter.  1. Arthritis of right hand   2. Right hand pain     Recommended an oral prednisone course for pain likely secondary to arthritic changes.  Recommended general RICE method, Tylenol otherwise.  Follow-up with a hand specialist. Counseled patient on potential for adverse effects with medications prescribed/recommended today, ER and return-to-clinic precautions discussed, patient verbalized understanding.    Jaynee Eagles, [place] 05/24/22 1133

## 2022-05-24 NOTE — ED Triage Notes (Signed)
Pt c/o pain to right thumb x 2-3 days-denies injury-pt wearing velcro wrist brace-NAD-steady gait

## 2022-05-24 NOTE — Discharge Instructions (Addendum)
I recommend using prednisone for your hand pain which I suspect is related to an arthritis flare as this is confirmed through the x-ray.  Please follow-up with hand specialist, either your surgeon for before or [facility].  You can use Tylenol for pain relief as well as general rest, ice, compression with an Ace wrap.

## 2022-08-17 ENCOUNTER — Other Ambulatory Visit (HOSPITAL_COMMUNITY): Payer: Self-pay | Admitting: Family Medicine

## 2022-08-17 DIAGNOSIS — Z136 Encounter for screening for cardiovascular disorders: Secondary | ICD-10-CM

## 2022-08-24 ENCOUNTER — Ambulatory Visit (HOSPITAL_COMMUNITY)
Admission: RE | Admit: 2022-08-24 | Discharge: 2022-08-24 | Disposition: A | Discharge status: Home / Self Care | Payer: Managed Care, Other (non HMO) | Source: Ambulatory Visit | Attending: Family Medicine | Admitting: Family Medicine

## 2022-08-24 DIAGNOSIS — Z136 Encounter for screening for cardiovascular disorders: Secondary | ICD-10-CM

## 2022-10-17 IMAGING — MG MM DIGITAL SCREENING BILAT W/ TOMO AND CAD
6 of 10 series · 6 of 30 positions shown · non-contrast
Comparison: Previous exam(s).

CLINICAL DATA: Screening.

EXAM:
DIGITAL SCREENING BILATERAL MAMMOGRAM WITH TOMOSYNTHESIS AND CAD
TECHNIQUE: Bilateral screening digital craniocaudal and mediolateral oblique
mammograms were obtained. Bilateral screening digital breast
tomosynthesis was performed. The images were evaluated with
computer-aided detection.

[L MLO synth-2D]
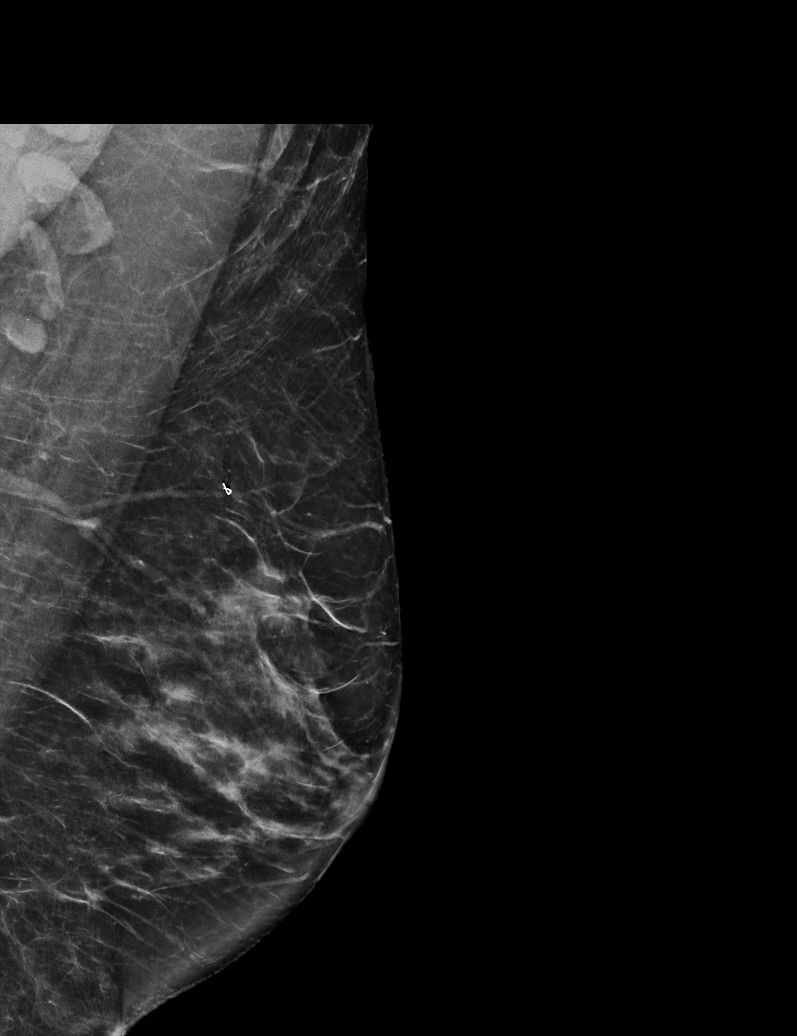

[R CC synth-2D]
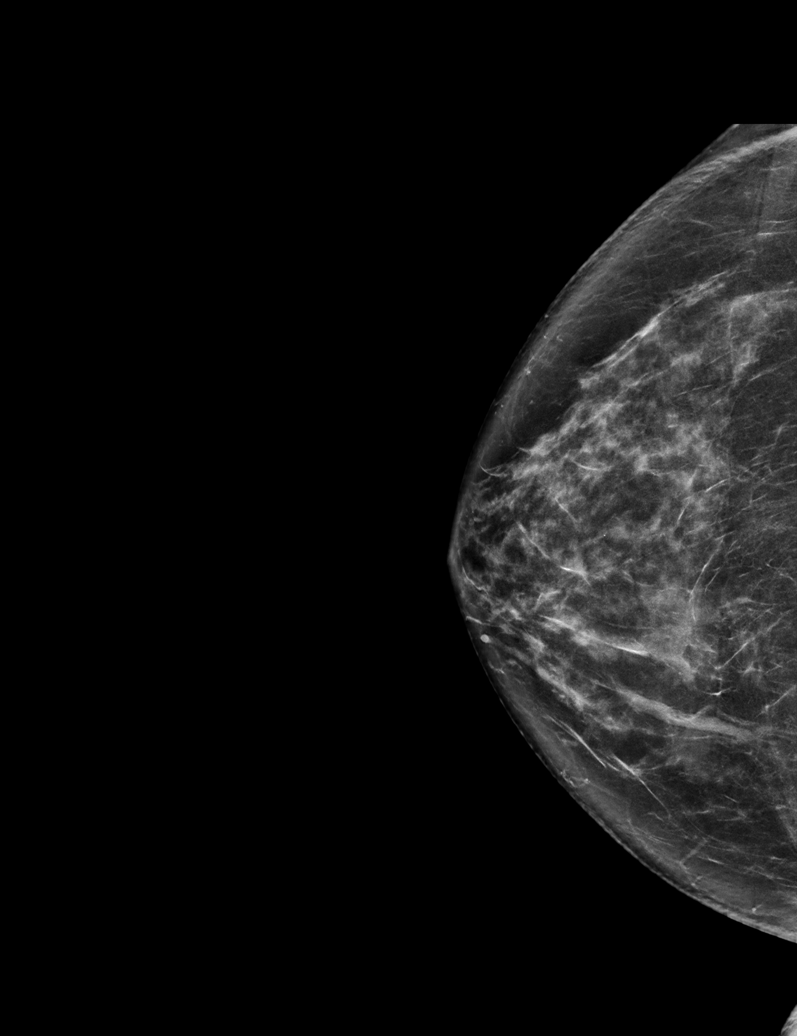

[L CC synth-2D]
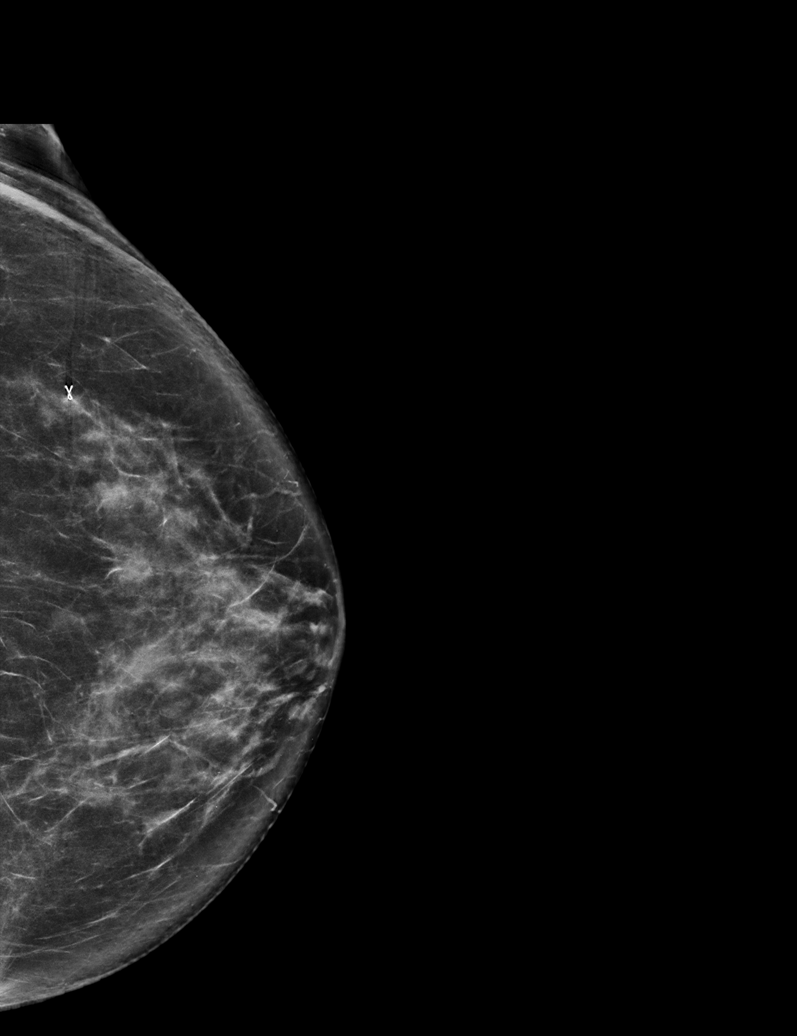

[R MLO synth-2D (1 of 2)]
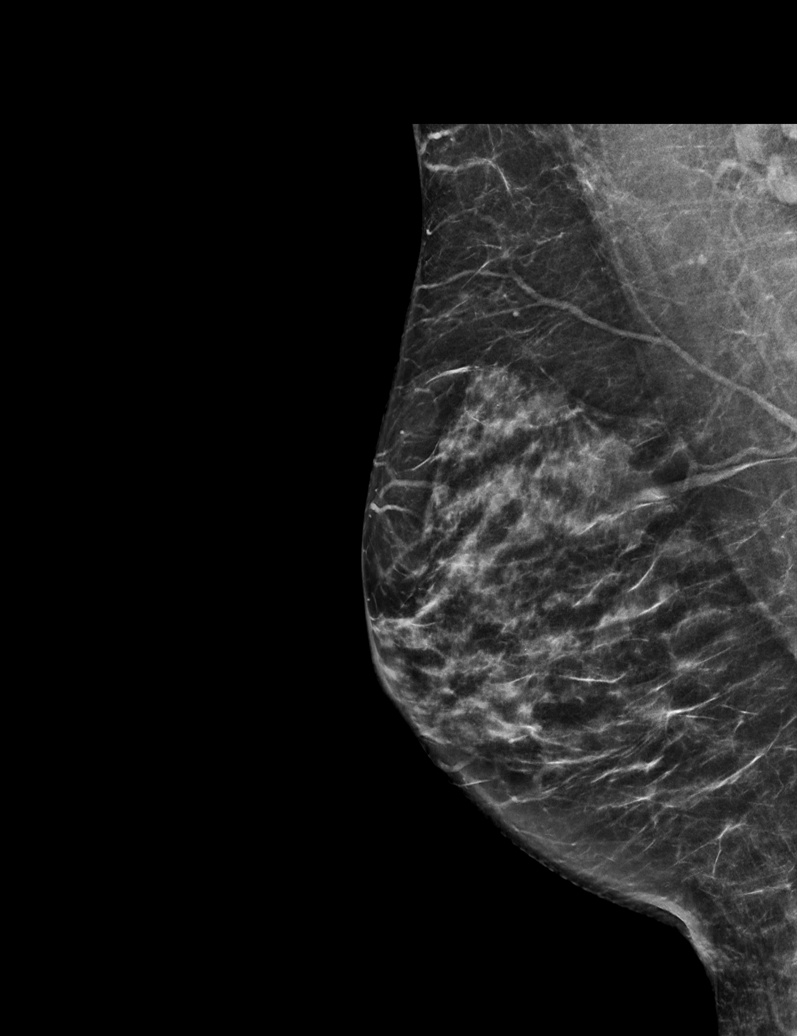

[R MLO synth-2D (2 of 2)]
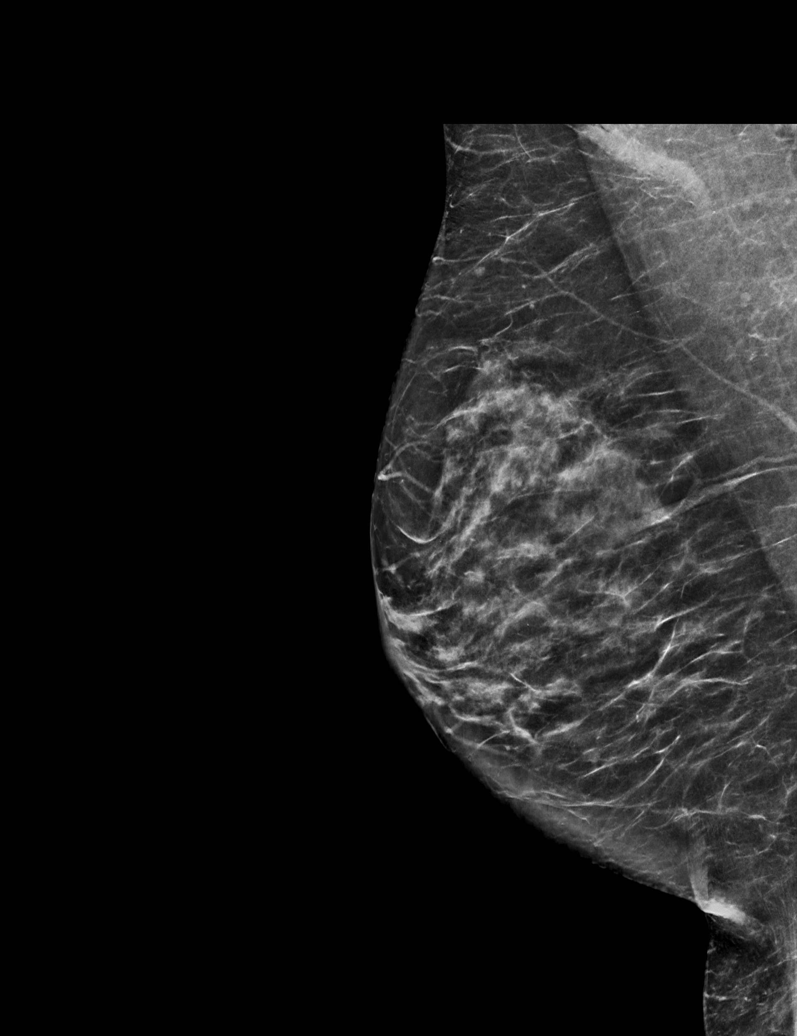

[R CC tomo · tomo slice 36/71.0]
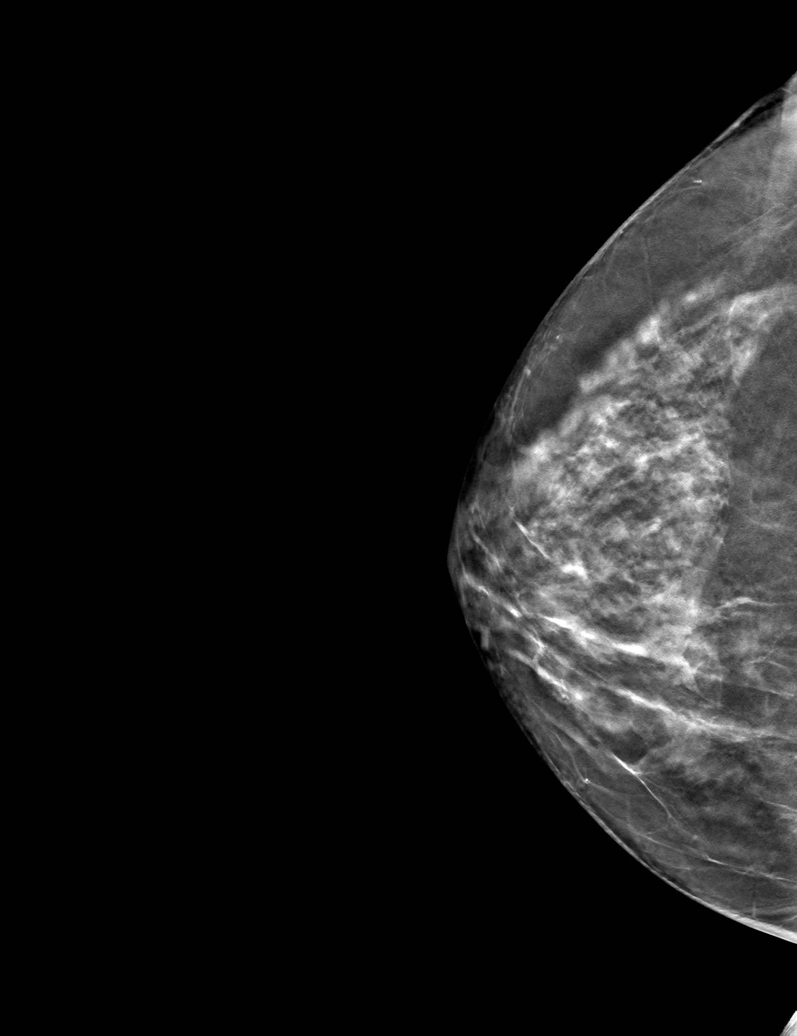

[6 of 30 positions shown; findings below may reference images not displayed]

ACR Breast Density Category c: The breast tissue is heterogeneously
dense, which may obscure small masses.
FINDINGS: There are no findings suspicious for malignancy.
IMPRESSION: No mammographic evidence of malignancy. A result letter of this
screening mammogram will be mailed directly to the patient.

RECOMMENDATION:
Screening mammogram in one year. (Code:Q3-W-BC3)

BI-RADS CATEGORY  1: Negative.

## 2023-04-19 ENCOUNTER — Other Ambulatory Visit: Payer: Self-pay | Admitting: Family Medicine

## 2023-04-19 DIAGNOSIS — Z1231 Encounter for screening mammogram for malignant neoplasm of breast: Secondary | ICD-10-CM

## 2023-05-30 ENCOUNTER — Ambulatory Visit
Admission: RE | Admit: 2023-05-30 | Discharge: 2023-05-30 | Disposition: A | Discharge status: Home / Self Care | Payer: PRIVATE HEALTH INSURANCE | Source: Ambulatory Visit | Attending: Family Medicine | Admitting: Family Medicine

## 2023-05-30 DIAGNOSIS — Z1231 Encounter for screening mammogram for malignant neoplasm of breast: Secondary | ICD-10-CM
